# Patient Record
Sex: Male | Born: 1945 | Race: White | Hispanic: No | Marital: Married | Smoking: Current every day smoker
Health system: Southern US, Community
[De-identification: ages and names within clinical notes are randomized; demographics above are authoritative.]

## PROBLEM LIST (undated history)

## (undated) DIAGNOSIS — C801 Malignant (primary) neoplasm, unspecified: Secondary | ICD-10-CM

## (undated) DIAGNOSIS — H9190 Unspecified hearing loss, unspecified ear: Secondary | ICD-10-CM

## (undated) DIAGNOSIS — H269 Unspecified cataract: Secondary | ICD-10-CM

## (undated) DIAGNOSIS — E119 Type 2 diabetes mellitus without complications: Secondary | ICD-10-CM

## (undated) DIAGNOSIS — H547 Unspecified visual loss: Secondary | ICD-10-CM

## (undated) DIAGNOSIS — K861 Other chronic pancreatitis: Secondary | ICD-10-CM

## (undated) HISTORY — PX: LUNG BIOPSY: SHX232

## (undated) HISTORY — DX: Type 2 diabetes mellitus without complications: E11.9

## (undated) HISTORY — PX: WRIST FRACTURE SURGERY: SHX121

## (undated) HISTORY — PX: APPENDECTOMY: SHX54

## (undated) HISTORY — DX: Unspecified cataract: H26.9

## (undated) HISTORY — PX: EYE SURGERY: SHX253

---

## 2001-01-21 ENCOUNTER — Ambulatory Visit (HOSPITAL_COMMUNITY): Admission: RE | Admit: 2001-01-21 | Discharge: 2001-01-21 | Payer: Self-pay | Admitting: Neurology

## 2001-01-21 ENCOUNTER — Encounter: Payer: Self-pay | Admitting: Neurology

## 2003-01-30 ENCOUNTER — Inpatient Hospital Stay (HOSPITAL_COMMUNITY): Admission: AD | Admit: 2003-01-30 | Discharge: 2003-02-01 | Payer: Self-pay | Admitting: *Deleted

## 2003-01-31 ENCOUNTER — Encounter: Payer: Self-pay | Admitting: *Deleted

## 2003-02-06 ENCOUNTER — Inpatient Hospital Stay (HOSPITAL_COMMUNITY): Admission: RE | Admit: 2003-02-06 | Discharge: 2003-02-09 | Payer: Self-pay | Admitting: Internal Medicine

## 2003-02-06 ENCOUNTER — Encounter (INDEPENDENT_AMBULATORY_CARE_PROVIDER_SITE_OTHER): Payer: Self-pay | Admitting: Internal Medicine

## 2003-02-13 ENCOUNTER — Ambulatory Visit (HOSPITAL_COMMUNITY): Admission: RE | Admit: 2003-02-13 | Discharge: 2003-02-13 | Payer: Self-pay | Admitting: Internal Medicine

## 2003-02-13 ENCOUNTER — Encounter (INDEPENDENT_AMBULATORY_CARE_PROVIDER_SITE_OTHER): Payer: Self-pay | Admitting: Internal Medicine

## 2003-02-25 ENCOUNTER — Encounter (INDEPENDENT_AMBULATORY_CARE_PROVIDER_SITE_OTHER): Payer: Self-pay | Admitting: Internal Medicine

## 2003-02-25 ENCOUNTER — Ambulatory Visit (HOSPITAL_COMMUNITY): Admission: RE | Admit: 2003-02-25 | Discharge: 2003-02-25 | Payer: Self-pay | Admitting: Internal Medicine

## 2003-09-11 ENCOUNTER — Ambulatory Visit (HOSPITAL_COMMUNITY): Admission: RE | Admit: 2003-09-11 | Discharge: 2003-09-11 | Payer: Self-pay | Admitting: Internal Medicine

## 2004-09-24 ENCOUNTER — Ambulatory Visit: Payer: Self-pay | Admitting: Internal Medicine

## 2004-09-28 ENCOUNTER — Ambulatory Visit (HOSPITAL_COMMUNITY): Admission: RE | Admit: 2004-09-28 | Discharge: 2004-09-28 | Payer: Self-pay | Admitting: Internal Medicine

## 2004-11-04 ENCOUNTER — Ambulatory Visit (HOSPITAL_COMMUNITY): Admission: RE | Admit: 2004-11-04 | Discharge: 2004-11-04 | Payer: Self-pay | Admitting: Internal Medicine

## 2008-12-05 ENCOUNTER — Emergency Department (HOSPITAL_COMMUNITY): Admission: EM | Admit: 2008-12-05 | Discharge: 2008-12-05 | Payer: Self-pay | Admitting: Emergency Medicine

## 2011-01-29 NOTE — Procedures (Signed)
   NAME:  ELLISON, RIETH NO.:  [id number]   MEDICAL RECORD NO.:  [id number]                   PATIENT TYPE:  OUT   LOCATION:  RAD                                  FACILITY:  [facility]   PHYSICIAN:  Vida Roller, M.D.                DATE OF BIRTH:  08/06/1946   DATE OF PROCEDURE:  02/25/2003  DATE OF DISCHARGE:                                  ECHOCARDIOGRAM   TAPE NUMBER:  The tape number is [id number].   TAPE COUNT:  The tape count is [id number] through 7046.   INDICATIONS:  This is a 65 year old guy with question of impaired cardial  effusion, abdominal pain and fluid build up in his abdomen.   STUDY:  Technical quality of the study was adequate, but incomplete.   FINDINGS:   M-MODE TRACING:  The aorta is 30 mm.   Left atrium is 41 mm.   The septum is 10 mm.   The posterior wall is 9 mm.   Left ventricular diastolic dimension is 43 mm.  Left ventricular systolic  dimension is 31 mm.   TWO-DIMENSIONAL AND DOPPLER IMAGING:  The left ventricle is normal size with  normal systolic function.  There are no wall motion abnormalities seen.  Diastolic function was not assessed.   The right ventricle is mildly enlarged with evidence of a dilated right  ventricular outflow tract.   Both atria are mildly enlarged.  There were no atrioseptal defects.   The aortic valve is trileaflet and tricommissural with no evidence of  stenosis or regurgitation.   The mitral valve is morphologically unremarkable with mild insufficiency.  No stenosis is seen.   The tricuspid valve is morphologically unremarkable with no stenosis or  regurgitation.   The pulmonic valve was not well seen,   The ascending aorta was not well seen.   The pericardial structures appears to have a very small echo lucent area in  the anterior portion of the heart, which could be a pericardial fat pad  versus a small loculated effusion of no hemodynamic significance.   The inferior vena cava  as not well imaged.                                                 Vida Roller, M.D.    JH/MEDQ  D:  02/25/2003  T:  02/25/2003  Job:  [id number]
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
## 2011-01-29 NOTE — Discharge Summary (Signed)
NAME:  Julian Woods, Julian Woods NO.:  [id number]   MEDICAL RECORD NO.:  [id number]                   PATIENT TYPE:  INP   LOCATION:  A305                                 FACILITY:  [facility]   PHYSICIAN:  Sarita Bottom, M.D.                  DATE OF BIRTH:  27-Aug-1946   DATE OF ADMISSION:  01/30/2003  DATE OF DISCHARGE:                                 DISCHARGE SUMMARY   DISCHARGE DIAGNOSES:  1. Community-acquired pneumonia, atypical.  2. Questionable interstitial lung disease.   DISCHARGE MEDICATIONS:  1. Suprax 500 mg three times daily for five more days.  2. Zithromax 500 daily for five days.  3. Atrovent MDI 2 puffs q.6h. p.r.n.   FOLLOW-UP:  The patient is to follow up with primary doctor, Dr. Reola Calkins, of  [facility].  Recommend follow-up chest x-ray and  possible evaluation by pulmonologist.   HISTORY OF PRESENT ILLNESS:  The patient is a 65 year old man.  No  significant chronic medical illness.  Was admitted to [facility] on  Jan 30, 2003, when he presented to the primary doctor's office with the  complaint of malaise, weakness, fever, and chills.  X-ray done in the  doctor's office was suggestive of pneumonia.  The  patient was brought to  [facility] for evaluation and treatment.   PHYSICAL EXAMINATION:  GENERAL:  Well-built, middle-aged man.  VITAL SIGNS:  BP of 107/70, heart rate of 101, temperature of 99.5.  CHEST:  Bilateral basilar crackles.  CARDIOVASCULAR:  Heart sounds 1 and 2 were normal.  ABDOMEN:  Benign.   ADMISSION LABORATORY DATA:  WBC of 21,000 with a neutrophil count of 92,000,  hemoglobin of 15.4.  Sodium was 132, potassium was 3.7, chloride was 100,  CO2 was 24, BUN of 12, creatinine of 1.  Admission ABG showed a pH of 7.4,  pCO2 of 32.8, pO2 of 58.4, bicarbonate of 21.5, oxygen saturation of 91.4%.   His chest x-ray revealed diffuse interstitial lung changes versus atypical  pneumonia.   HOSPITAL COURSE:  The patient was treated with IV ceftriaxone and Zithromax.  He was also placed on oxygen and Atrovent nebulization.  His lipid profile  was checked because the patient had admitted to having hypertriglyceridemia  which, however, was within normal limits.  His clinical course was  significant for improvement in his general medical condition and shortness  of breath.  The patient remained afebrile on IV antibiotics.  He has been  seen on ward rounds today.  He has expressed a desire to go home.  BP is  106/76,  temperature is __________.  His chest is clear to auscultation.  Physical  examination is essentially unremarkable.  The patient will be discharged  home today, to follow up with primary M.D.   CONDITION ON DISCHARGE:  Stable and satisfactory.  Sarita Bottom, M.D.    DW/MEDQ  D:  02/01/2003  T:  02/01/2003  Job:  [id number]   cc:   Dr. Ok Edwards [facility]

## 2011-01-29 NOTE — Discharge Summary (Signed)
NAME:  Julian Woods, Julian Woods NO.:  [id number]   MEDICAL RECORD NO.:  [id number]                   PATIENT TYPE:  INP   LOCATION:  A302                                 FACILITY:  [facility]   PHYSICIAN:  Lionel December, M.D.                 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   CONDITION ON DISCHARGE:  Much improved.   DISCHARGE MEDICATIONS:  1. Levaquin 500 mg p.o. q.d. x5 days.  2. Prednisone 30 mg p.o. q.a.m.  3. Combivent inhaler two puffs four times a day.  4. Robitussin OTC at bedtime p.r.n.   HISTORY OF PRESENT ILLNESS:  The patient is a 65 year old, Caucasian male  who has not been feeling well for about two weeks.  He was admitted to this  facility last week with diagnosis of pneumonia which was treated with  antibiotics.  However, two days after discharge, he was admitted to [facility] with a diagnosis of pancreatitis.  He was treated  and discharged.  However, he continued to have breathing difficulty and  abdominal pain which was not controlled with narcotics.  His wife was an  R.N. in the hospital and asked me to see the patient.  He was evaluated and  noted to have elevated leukocyte count of 25.6.  He had upper and lower  abdominal tenderness and he also had rales and rhonchi.  He was therefore  hospitalized.   LABORATORY DATA AND X-RAY FINDINGS:  His room air ABGs revealed pH of 747,  pCO2 of 29.8 and pO2 of 65.7.  His sedimentation rate was 26.  His H&H was  15 and 42.7, platelet count of 410.  His serum amylase was 104 and lipase  was mildly elevated at 76.  His LFTs were normal except ALT was 44 and  albumin was 2.8.   Chest x-ray revealed persistent bronchitic/interstitial changes  bilaterally,  left lower lobe atelectasis and small left effusion.   HOSPITAL COURSE:  He was begun on IV fluids with IV PPI analgesic and  bronchodilator therapy.  He had abdominopelvic CT which showed minimal  stranding of his pancreatic fat anterior to tail of pancreas felt to be  related to pancreatitis.  He had small bilateral effusions and small to  moderate pericardial effusion.  He did not have any pericardial rub.  His  differential was noted to be with 20% eos.  The following day, his white  cell count was up to 32.7 and he had 23% eos.  It was therefore felt that he  had eosinophilic syndrome causing his pneumonia and possibly pancreatitis  and he was started on IV Solu-Medrol.  He was remarkably improved after  second  dose of Solu-Medrol.  His abdominal tenderness and pain resolved.  He  was also breathing better with improvement in his wheezing.  However, his  pulmonary symptoms were not completely resolved by the time he was  discharged.  He tolerated diet without any difficulty.  He was having less  cough and less rhonchi.  He was discharged to home in much improved  condition.  He is being placed on medical leave.   FOLLOW UP:  Plan is for him to return for CBC, followup chest x-ray and  echocardiogram prior to his office visit in two weeks from now.                                                Lionel December, M.D.    NR/MEDQ  D:  02/11/2003  T:  02/11/2003  Job:  [id number]   cc:   Gaynelle Cage, MD  [phone] [address]  [place]  [place] [address]  Fax: [phone]

## 2015-09-14 HISTORY — PX: CATARACT EXTRACTION: SUR2

## 2017-11-23 NOTE — Progress Notes (Signed)
[facility] Note  11/24/2017     CHIEF COMPLAINT Patient presents for Retina Evaluation and Diabetic Eye Exam   HISTORY OF PRESENT ILLNESS: Julian Woods is a 72 y.o. male who presents to the clinic today for:   HPI    Retina Evaluation    In both eyes.  This started 4 days ago.  Associated Symptoms Blind Spot.  Negative for Glare, Shoulder/Hip pain, Jaw Claudication, Fatigue, Photophobia, Distortion, Floaters, Redness, Scalp Tenderness, Weight Loss, Fever, Trauma, Pain and Flashes.  Context:  distance vision, mid-range vision and near vision.  Treatments tried include no treatments.  I, the attending physician,  performed the HPI with the patient and updated documentation appropriately.          Diabetic Eye Exam    Vision is stable.  Associated Symptoms Negative for Blind Spot, Glare, Shoulder/Hip pain, Fatigue, Jaw Claudication, Photophobia, Distortion, Floaters, Redness, Scalp Tenderness, Weight Loss, Fever, Trauma, Pain and Flashes.  Diabetes characteristics include on insulin, taking oral medications and Type 2.  This started 9 years ago.  Blood sugar level is controlled.  Last Blood Glucose 211.  Last A1C 7.9.  I, the attending physician,  performed the HPI with the patient and updated documentation appropriately.          Comments    Referral from [facility] for eval. For ARMD. Patient states he woke with a black spot in his central vision on Sunday (11/20/17). Pt is DM2, bs are stable per pt , Bs are controled by isulin and glipizide. BS 211 this am, A1C 7.9 (09/2017). Pt reports he lost his hearing left ear three months ago.       Last edited by Bernarda Caffey, MD on 11/24/2017  9:55 AM. (History)    Pt states he "woke up Sunday morning and couldn't see" out of OS; Pt states he went to the [facility] and was told "my retina is bleeding"; Pt reports he had cataract sx approx 3 years ago from [facility]; Pt states OU VA "gets blurry at  times but I think its from my sugar"; Pt states he has been told he does not have macular degeneration; Pt states OS VA was better than OD prior to VA decrease; Pt denies being on blood thinners; Pt reports he is a current everyday smoker, states he smokes small filtered cigars; Pt states he had no ocular issues prior to March 10th; Pt states he "lost hearing in my right ear" x 3 weeks ago, states there was a "loud ringing in my ear and then I lost my hearing";   Referring physician: [facility] [address] [place], [place] [address]  HISTORICAL INFORMATION:   Selected notes from the MEDICAL RECORD NUMBER Referred by [facility] for retinal evaluation;  LEE- 03.12.19 (J. Mazzarella) [BCVA OD: 20/25 OS: HM] Ocular Hx- Exu AMD OS, pseudophakia OU PMH- DM (A1C- 8.9 (01.23.19))    CURRENT MEDICATIONS: No current outpatient medications on file. (Ophthalmic Drugs)   No current facility-administered medications for this visit.  (Ophthalmic Drugs)   Current Outpatient Medications (Other)  Medication Sig  . gemfibrozil (LOPID) 600 MG tablet Take by mouth.  Marland Kitchen glipiZIDE (GLUCOTROL) 10 MG tablet Take by mouth.  . insulin regular (NOVOLIN R,HUMULIN R) 100 units/mL injection Inject into the skin.  Marland Kitchen morphine (KADIAN) 10 MG 24 hr capsule Take by mouth.  . traMADol (ULTRAM) 50 MG tablet Take by mouth.   Current Facility-Administered Medications (  Other)  Medication Route  . Bevacizumab (AVASTIN) SOLN 1.25 mg Intravitreal      REVIEW OF SYSTEMS: ROS    Positive for: Endocrine, Cardiovascular, Eyes   Negative for: Constitutional, Gastrointestinal, Neurological, Skin, Genitourinary, Musculoskeletal, HENT, Respiratory, Psychiatric, Allergic/Imm, Heme/Lymph   Last edited by Zenovia Jordan, LPN on 4/62/7035  0:09 AM. (History)       ALLERGIES Allergies  Allergen Reactions  . Ventolin [Albuterol] Anaphylaxis    PAST MEDICAL HISTORY Past Medical  History:  Diagnosis Date  . Cataract   . Diabetes [facility])    Past Surgical History:  Procedure Laterality Date  . APPENDECTOMY    . CATARACT EXTRACTION  2017  . EYE SURGERY    . LUNG BIOPSY    . WRIST FRACTURE SURGERY      FAMILY HISTORY History reviewed. No pertinent family history.  SOCIAL HISTORY Social History   Tobacco Use  . Smoking status: Not on file  Substance Use Topics  . Alcohol use: Not on file  . Drug use: Not on file         OPHTHALMIC EXAM:  Base Eye Exam    Visual Acuity (Snellen - Linear)      Right Left   Dist cc 20/25 +2 CF at face   Dist ph cc NI NI   Correction:  Glasses       Tonometry (Tonopen, 9:42 AM)      Right Left   Pressure 14 16       Pupils      Dark Light Shape React APD   Right 2 1.5 Round Slow None   Left 2 1.5 Round Slow None       Visual Fields (Counting fingers)      Left Right     Full   Restrictions Total superior temporal, inferior temporal, superior nasal, inferior nasal deficiencies        Extraocular Movement      Right Left    Full, Ortho Full, Ortho       Neuro/Psych    Oriented x3:  Yes   Mood/Affect:  Normal       Dilation    Both eyes:  1.0% Mydriacyl, 2.5% Phenylephrine @ 9:43 AM        Slit Lamp and Fundus Exam    External Exam      Right Left   External Brow ptosis Brow ptosis       Slit Lamp Exam      Right Left   Lids/Lashes Dermatochalasis - upper lid Dermatochalasis - upper lid   Conjunctiva/Sclera White and quiet Mild Nasal and temporal Pinguecula   Cornea Mild Arcus, Well healed temporal cataract wounds Mild Arcus, Well healed temporal cataract wounds, 1+ Punctate epithelial erosions   Anterior Chamber Deep and quiet, no cell or flare Deep and quiet, no cell or flare   Iris Round and dilated Round and dilated   Lens Three piece PC IOL in good position, clear PC Three piece PC IOL in good position, clear PC   Vitreous Vitreous syneresis Vitreous syneresis       Fundus Exam       Right Left   Disc Normal    C/D Ratio 0.35 0.3   Macula Blunted foveal reflex, Retinal pigment epithelial mottling, mild  Drusen Massive sub-retinal hemorrhage covering 80% of macula extending below inferior arcades,    Vessels Mild Vascular attenuation Normal   Periphery Attached Attached        Refraction  Wearing Rx      Sphere Cylinder Axis Add   Right -0.25 +1.00 012 +2.25   Left [place] Sphere  +2.25   Type:  Bifocal       Manifest Refraction      Sphere Cylinder Axis Dist VA   Right -0.25 +1.50 012 20/20-1   Left      Pt states he only sees black out of OS          IMAGING AND PROCEDURES  Imaging and Procedures for 11/25/17  OCT, Retina - OU - Both Eyes     Right Eye Quality was good. Central Foveal Thickness: 291. Progression has no prior data. Findings include normal foveal contour, no IRF, no SRF, retinal drusen , pigment epithelial detachment.   Left Eye Quality was good. Central Foveal Thickness: 521. Progression has no prior data. Findings include abnormal foveal contour, subretinal fluid, subretinal hyper-reflective material, pigment epithelial detachment, intraretinal fluid.   Notes *Images captured and stored on drive  Diagnosis / Impression:  OD: non-exudative ARMD OS: exudative ARMD with massive sub-foveal hemorrhage  Clinical management:  See below  Abbreviations: NFP - Normal foveal profile. CME - cystoid macular edema. PED - pigment epithelial detachment. IRF - intraretinal fluid. SRF - subretinal fluid. EZ - ellipsoid zone. ERM - epiretinal membrane. ORA - outer retinal atrophy. ORT - outer retinal tubulation. SRHM - subretinal hyper-reflective material         Intravitreal Injection, Pharmacologic Agent - OS - Left Eye     Time Out 11/24/2017. 11:00 AM. Confirmed correct patient, procedure, site, and patient consented.   Anesthesia Topical anesthesia was used. Anesthetic medications included Tetracaine 0.5%, Lidocaine 2%.    Procedure Preparation included 5% betadine to ocular surface, eyelid speculum. A 30 gauge needle was used.   Injection: 1.25 mg Bevacizumab 1.25mg /0.60ml   NDC: 87564-332-95    Lot: [id number]     Expiration Date: 12/25/2017   Route: Intravitreal   Site: Left Eye   Waste: 0 mg  Post-op Post injection exam found visual acuity of at least counting fingers. The patient tolerated the procedure well. There were no complications. The patient received written and verbal post procedure care education.                 ASSESSMENT/PLAN:    ICD-10-CM   1. Exudative age-related macular degeneration of left eye with active choroidal neovascularization (HCC) H35.3221 Intravitreal Injection, Pharmacologic Agent - OS - Left Eye    Bevacizumab (AVASTIN) SOLN 1.25 mg  2. Macular subretinal hemorrhage of left eye H35.62 Intravitreal Injection, Pharmacologic Agent - OS - Left Eye    Bevacizumab (AVASTIN) SOLN 1.25 mg  3. Retinal edema H35.81 OCT, Retina - OU - Both Eyes  4. 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 Pseudophakia of both eyes Z96.1     1,2,3. Exudative age related macular degeneration, OS   - The incidence pathology and anatomy of wet AMD discussed   - The ANCHOR, MARINA, CATT and VIEW trials discussed with patient.    - discussed treatment options including observation vs intravitreal anti-VEGF agents such as Avastin, Lucentis, Eylea.    - Risks of endophthalmitis and vascular occlusive events and atrophic changes discussed with patient  - massive submacular/subfoveal hemorrhage, acute onset of vision loss 11/20/17  - OCT with massive macular edema secondary to hemorrhage  - discussed treatment options including surgery to try to displace subretinal hemorrhage and anti-VEGF therapy  - pt  wishes to be treated with IVA OS #1 today, 3.14.19  - RBA of procedure discussed, questions answered  - informed consent obtained and  signed  - see procedure note  - recommend second opinion from another retinal specialist regarding surgery to displace subretinal heme  - long discussion of guarded prognosis OS   - will refer to Drs. Dwana Melena and Ellyn Hack, Vitreoretinal Service at [facility] for 2nd opinion / evaluation and management of subretinal heme OS  - f/u in 4-6 wks   4. Age related macular degeneration, non-exudative, OD  - The incidence, anatomy, and pathology of dry AMD, risk of progression, and the AREDS and AREDS 2 study including smoking risks discussed with patient.  - Recommend amsler grid monitoring  5. Pseudophakia OU  - s/p CE/IOL OU at [facility]  - doing well  - monitor   Ophthalmic Meds Ordered this visit:  Meds ordered this encounter  Medications  . Bevacizumab (AVASTIN) SOLN 1.25 mg       Return in about 4 weeks (around 12/22/2017) for Dilated Exam, OCT.  There are no Patient Instructions on file for this visit.   Explained the diagnoses, plan, and follow up with the patient and they expressed understanding.  Patient expressed understanding of the importance of proper follow up care.   This document serves as a record of services personally performed by Gardiner Sleeper, MD, PhD. It was created on their behalf by Catha Brow, [place], a certified ophthalmic assistant. The creation of this record is the provider's dictation and/or activities during the visit.  Electronically signed by: Catha Brow, [place]  11/25/17 12:54 PM    Gardiner Sleeper, M.D., Ph.D. Diseases & Surgery of the Retina and [place] 11/25/17   I have reviewed the above documentation for accuracy and completeness, and I agree with the above. Gardiner Sleeper, M.D., Ph.D. 11/25/17 12:54 PM     Abbreviations: M myopia (nearsighted); A astigmatism; H hyperopia (farsighted); P presbyopia; Mrx spectacle prescription;  CTL contact lenses; OD right eye;  OS left eye; OU both eyes  XT exotropia; ET esotropia; PEK punctate epithelial keratitis; PEE punctate epithelial erosions; DES dry eye syndrome; MGD meibomian gland dysfunction; ATs artificial tears; PFAT's preservative free artificial tears; [place] nuclear sclerotic cataract; PSC posterior subcapsular cataract; ERM epi-retinal membrane; PVD posterior vitreous detachment; RD retinal detachment; DM diabetes mellitus; DR diabetic retinopathy; NPDR non-proliferative diabetic retinopathy; PDR proliferative diabetic retinopathy; CSME clinically significant macular edema; DME diabetic macular edema; dbh dot blot hemorrhages; CWS cotton wool spot; POAG primary open angle glaucoma; C/D cup-to-disc ratio; HVF humphrey visual field; GVF goldmann visual field; OCT optical coherence tomography; IOP intraocular pressure; BRVO Branch retinal vein occlusion; CRVO central retinal vein occlusion; CRAO central retinal artery occlusion; BRAO branch retinal artery occlusion; RT retinal tear; SB scleral buckle; PPV pars plana vitrectomy; VH Vitreous hemorrhage; PRP panretinal laser photocoagulation; IVK intravitreal kenalog; VMT vitreomacular traction; MH Macular hole;  NVD neovascularization of the disc; NVE neovascularization elsewhere; AREDS age related eye disease study; ARMD age related macular degeneration; POAG primary open angle glaucoma; EBMD epithelial/anterior basement membrane dystrophy; ACIOL anterior chamber intraocular lens; IOL intraocular lens; PCIOL posterior chamber intraocular lens; Phaco/IOL phacoemulsification with intraocular lens placement; [place] photorefractive keratectomy; LASIK laser assisted in situ keratomileusis; HTN hypertension; DM diabetes mellitus; COPD chronic obstructive pulmonary disease

## 2017-11-24 ENCOUNTER — Encounter (INDEPENDENT_AMBULATORY_CARE_PROVIDER_SITE_OTHER): Payer: Self-pay | Admitting: Ophthalmology

## 2017-11-24 ENCOUNTER — Ambulatory Visit (INDEPENDENT_AMBULATORY_CARE_PROVIDER_SITE_OTHER): Payer: Medicare Other | Admitting: Ophthalmology

## 2017-11-24 DIAGNOSIS — H3581 Retinal edema: Secondary | ICD-10-CM

## 2017-11-24 DIAGNOSIS — Z961 Presence of intraocular lens: Secondary | ICD-10-CM

## 2017-11-24 DIAGNOSIS — H353111 Nonexudative age-related macular degeneration, right eye, early dry stage: Secondary | ICD-10-CM

## 2017-11-24 DIAGNOSIS — H353221 Exudative age-related macular degeneration, left eye, with active choroidal neovascularization: Secondary | ICD-10-CM | POA: Diagnosis not present

## 2017-11-24 DIAGNOSIS — H3562 Retinal hemorrhage, left eye: Secondary | ICD-10-CM | POA: Diagnosis not present

## 2017-11-25 ENCOUNTER — Encounter (INDEPENDENT_AMBULATORY_CARE_PROVIDER_SITE_OTHER): Payer: Self-pay | Admitting: Ophthalmology

## 2017-11-25 DIAGNOSIS — H3562 Retinal hemorrhage, left eye: Secondary | ICD-10-CM | POA: Diagnosis not present

## 2017-11-25 DIAGNOSIS — H353221 Exudative age-related macular degeneration, left eye, with active choroidal neovascularization: Secondary | ICD-10-CM | POA: Diagnosis not present

## 2017-11-25 MED ORDER — BEVACIZUMAB CHEMO INJECTION 1.25MG/0.05ML SYRINGE FOR KALEIDOSCOPE
1.2500 mg | INTRAVITREAL | Status: DC
  Administered 2017-11-25: 1.25 mg via INTRAVITREAL
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
## 2017-12-23 ENCOUNTER — Ambulatory Visit (INDEPENDENT_AMBULATORY_CARE_PROVIDER_SITE_OTHER): Payer: Medicare Other | Admitting: Ophthalmology

## 2017-12-23 ENCOUNTER — Encounter (INDEPENDENT_AMBULATORY_CARE_PROVIDER_SITE_OTHER): Payer: Self-pay | Admitting: Ophthalmology

## 2017-12-23 DIAGNOSIS — H3562 Retinal hemorrhage, left eye: Secondary | ICD-10-CM | POA: Diagnosis not present

## 2017-12-23 DIAGNOSIS — H3581 Retinal edema: Secondary | ICD-10-CM

## 2017-12-23 DIAGNOSIS — H353111 Nonexudative age-related macular degeneration, right eye, early dry stage: Secondary | ICD-10-CM

## 2017-12-23 DIAGNOSIS — Z961 Presence of intraocular lens: Secondary | ICD-10-CM

## 2017-12-23 DIAGNOSIS — H353221 Exudative age-related macular degeneration, left eye, with active choroidal neovascularization: Secondary | ICD-10-CM | POA: Diagnosis not present

## 2017-12-26 ENCOUNTER — Encounter (INDEPENDENT_AMBULATORY_CARE_PROVIDER_SITE_OTHER): Payer: Self-pay | Admitting: Ophthalmology

## 2017-12-28 NOTE — Progress Notes (Signed)
[facility] Note  12/30/2017     CHIEF COMPLAINT Patient presents for Retina Follow Up   HISTORY OF PRESENT ILLNESS: Julian Woods is a 72 y.o. male who presents to the clinic today for:   HPI    Retina Follow Up    Patient presents with  Wet AMD.  In left eye.  Severity is severe.  Since onset it is stable.  I, the attending physician,  performed the HPI with the patient and updated documentation appropriately.          Comments    72 y/o male pt here for 1 wk f/u for exudative amd os w/active choroidal neovascularization.  No change in vision ou.  Denies pain, flashes.  Is seeing a few more wavy floaters ou.  ATs prn ou.  BS this a.m. Was 180.  A1C 2 wks ago was 7.0.       Last edited by Bernarda Caffey, MD on 01/02/2018  9:32 AM. (History)    Here today for IVA injection OS  Referring physician: No referring provider defined for this encounter.  HISTORICAL INFORMATION:   Selected notes from the MEDICAL RECORD NUMBER Referred by [place] for retinal evaluation;  LEE- 03.12.19 (J. Mazzarella) [BCVA OD: 20/25 OS: HM] Ocular Hx- Exu AMD OS, pseudophakia OU PMH- DM (A1C- 8.9 (01.23.19))    CURRENT MEDICATIONS: No current outpatient medications on file. (Ophthalmic Drugs)   No current facility-administered medications for this visit.  (Ophthalmic Drugs)   Current Outpatient Medications (Other)  Medication Sig  . gemfibrozil (LOPID) 600 MG tablet Take by mouth.  Marland Kitchen glipiZIDE (GLUCOTROL) 10 MG tablet Take by mouth.  . insulin regular (NOVOLIN R,HUMULIN R) 100 units/mL injection Inject into the skin.  Marland Kitchen morphine (KADIAN) 10 MG 24 hr capsule Take by mouth.  . traMADol (ULTRAM) 50 MG tablet Take by mouth.   Current Facility-Administered Medications (Other)  Medication Route  . Bevacizumab (AVASTIN) SOLN 1.25 mg Intravitreal  . Bevacizumab (AVASTIN) SOLN 1.25 mg Intravitreal      REVIEW OF SYSTEMS: ROS    Positive for:  Endocrine, Eyes   Negative for: Constitutional, Gastrointestinal, Neurological, Skin, Genitourinary, Musculoskeletal, HENT, Cardiovascular, Respiratory, Psychiatric, Allergic/Imm, Heme/Lymph   Last edited by Matthew Folks, COA on 12/30/2017 10:38 AM. (History)       ALLERGIES Allergies  Allergen Reactions  . Ventolin [Albuterol] Anaphylaxis    PAST MEDICAL HISTORY Past Medical History:  Diagnosis Date  . Cataract   . Diabetes [facility])    Past Surgical History:  Procedure Laterality Date  . APPENDECTOMY    . CATARACT EXTRACTION  2017  . EYE SURGERY    . LUNG BIOPSY    . WRIST FRACTURE SURGERY      FAMILY HISTORY History reviewed. No pertinent family history.  SOCIAL HISTORY Social History   Tobacco Use  . Smoking status: Not on file  Substance Use Topics  . Alcohol use: Not on file  . Drug use: Not on file         OPHTHALMIC EXAM:  Base Eye Exam    Visual Acuity (Snellen - Linear)      Right Left   Dist [place] 20/25 +2 CF @ face   Dist ph cc  CF @ face   Correction:  Glasses       Tonometry (Tonopen, 10:35 AM)      Right Left   Pressure 10 12       Pupils  Dark Light Shape React APD   Right 2 1.5 Round Slow None   Left 2 1.5 Round Slow None       Visual Fields (Counting fingers)      Left Right     Full   Restrictions Total superior temporal, inferior temporal, superior nasal, inferior nasal deficiencies        Extraocular Movement      Right Left    Full, Ortho Full, Ortho       Neuro/Psych    Oriented x3:  Yes   Mood/Affect:  Normal       Dilation    Both eyes:  1.0% Mydriacyl, 2.5% Phenylephrine @ 10:35 AM        Slit Lamp and Fundus Exam    External Exam      Right Left   External Brow ptosis Brow ptosis       Slit Lamp Exam      Right Left   Lids/Lashes Dermatochalasis - upper lid Dermatochalasis - upper lid   Conjunctiva/Sclera White and quiet Mild Nasal and temporal Pinguecula   Cornea Mild Arcus, Well healed  temporal cataract wounds Mild Arcus, Well healed temporal cataract wounds, 1+ Punctate epithelial erosions   Anterior Chamber Deep and quiet, no cell or flare Deep and quiet, no cell or flare   Iris Round and dilated Round and dilated   Lens Three piece PC IOL in good position, clear PC Three piece PC IOL in good position, clear PC   Vitreous Vitreous syneresis, Posterior vitreous detachment, Weiss ring Vitreous syneresis, central whit vitreous hemorrhage       Fundus Exam      Right Left   Disc Pink and Sharp    C/D Ratio 0.3 0.3   Macula Blunted foveal reflex, Retinal pigment epithelial mottling, mild  Drusen white sub-retinal hemorrhage clearing   Vessels Mild Vascular attenuation Normal   Periphery Attached Attached          IMAGING AND PROCEDURES  Imaging and Procedures for 01/02/18  OCT, Retina - OU - Both Eyes       Right Eye Quality was good. Central Foveal Thickness: 293. Progression has been stable. Findings include normal foveal contour, no IRF, no SRF, retinal drusen , pigment epithelial detachment.   Left Eye Quality was good. Central Foveal Thickness: (Unable to obtain). Progression has been stable (Unable to compare). Findings include abnormal foveal contour, subretinal fluid, subretinal hyper-reflective material, pigment epithelial detachment, intraretinal fluid.   Notes *Images captured and stored on drive  Diagnosis / Impression:  OD: non-exudative ARMD OS: exudative ARMD with massive sub-foveal hemorrhage  Clinical management:  See below  Abbreviations: NFP - Normal foveal profile. CME - cystoid macular edema. PED - pigment epithelial detachment. IRF - intraretinal fluid. SRF - subretinal fluid. EZ - ellipsoid zone. ERM - epiretinal membrane. ORA - outer retinal atrophy. ORT - outer retinal tubulation. SRHM - subretinal hyper-reflective material         Intravitreal Injection, Pharmacologic Agent - OS - Left Eye       Time Out 12/30/2017. 11:59  AM. Confirmed correct patient, procedure, site, and patient consented.   Anesthesia Topical anesthesia was used. Anesthetic medications included Tetracaine 0.5%, Lidocaine 2%.   Procedure Preparation included 5% betadine to ocular surface, eyelid speculum. A supplied needle was used.   Injection: 1.25 mg Bevacizumab 1.25mg /0.65ml   NDC: 73220-254-27    Lot: [id number]     Expiration Date: 01/25/2018   Route: Intravitreal   Site:  Left Eye   Waste: 0 mg  Post-op Post injection exam found visual acuity of at least counting fingers. The patient tolerated the procedure well. There were no complications. The patient received written and verbal post procedure care education.                 ASSESSMENT/PLAN:    ICD-10-CM   1. Exudative age-related macular degeneration of left eye with active choroidal neovascularization (HCC) H35.3221 OCT, Retina - OU - Both Eyes    Intravitreal Injection, Pharmacologic Agent - OS - Left Eye    Bevacizumab (AVASTIN) SOLN 1.25 mg  2. Macular subretinal hemorrhage of left eye H35.62   3. Retinal edema H35.81   4. 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 Pseudophakia of both eyes Z96.1     1,2,3. Exudative age related macular degeneration, OS   - The incidence pathology and anatomy of wet AMD discussed   - The ANCHOR, MARINA, CATT and VIEW trials discussed with patient.    - discussed treatment options including observation vs intravitreal anti-VEGF agents such as Avastin, Lucentis, Eylea.    - Risks of endophthalmitis and vascular occlusive events and atrophic changes discussed with patient  - massive submacular/subfoveal hemorrhage, acute onset of vision loss 11/20/17  - S/P IVA OS #1 (03.14.19)  - today, subfoveal heme is turning white and there is some breakthrough heme into vitreous  - OCT with persistent massive macular edema secondary to hemorrhage  - recommend IVA #2 OS today (04.19.18)  -  RBA of procedure discussed, questions answered  - informed consent obtained and signed  - see procedure note  - discussed treatment options including surgery to try to displace subretinal hemorrhage and anti-VEGF therapy   - recommended second opinion from another retinal specialist regarding surgery to displace subretinal heme  - long discussion of guarded prognosis OS   - referred to Drs. Dwana Melena and Ellyn Hack, Vitreoretinal Service at [facility] for 2nd opinion / evaluation and management of subretinal heme OS - pt refuses to go to [facility]  - f/u in 4 weeks  4. Age related macular degeneration, non-exudative, OD  - The incidence, anatomy, and pathology of dry AMD, risk of progression, and the AREDS and AREDS 2 study including smoking risks discussed with patient.  - Recommend amsler grid monitoring  - continue amsler grid monitoring  5. Pseudophakia OU  - s/p CE[facility]OL OU at [facility]  - doing well  - continue to moniotr   Ophthalmic Meds Ordered this visit:  Meds ordered this encounter  Medications  . Bevacizumab (AVASTIN) SOLN 1.25 mg       Return in about 1 month (around 01/27/2018) for F/U Exu AMD OS, Dilated exam, OCT.  There are no Patient Instructions on file for this visit.   Explained the diagnoses, plan, and follow up with the patient and they expressed understanding.  Patient expressed understanding of the importance of proper follow up care.   This document serves as a record of services personally performed by Gardiner Sleeper, MD, PhD. It was created on their behalf by Catha Brow, [place], a certified ophthalmic assistant. The creation of this record is the provider's dictation and/or activities during the visit.  Electronically signed by: Catha Brow, [place]  01/02/18 9:39 AM   Gardiner Sleeper, M.D., Ph.D. Diseases & Surgery of the Retina and [place] 01/02/18  I have reviewed  the above documentation for accuracy and completeness, and I agree with the above. Gardiner Sleeper, M.D., Ph.D. 01/02/18 9:40 AM    Abbreviations: M myopia (nearsighted); A astigmatism; H hyperopia (farsighted); P presbyopia; Mrx spectacle prescription;  CTL contact lenses; OD right eye; OS left eye; OU both eyes  XT exotropia; ET esotropia; PEK punctate epithelial keratitis; PEE punctate epithelial erosions; DES dry eye syndrome; MGD meibomian gland dysfunction; ATs artificial tears; PFAT's preservative free artificial tears; [place] nuclear sclerotic cataract; PSC posterior subcapsular cataract; ERM epi-retinal membrane; PVD posterior vitreous detachment; RD retinal detachment; DM diabetes mellitus; DR diabetic retinopathy; NPDR non-proliferative diabetic retinopathy; PDR proliferative diabetic retinopathy; CSME clinically significant macular edema; DME diabetic macular edema; dbh dot blot hemorrhages; CWS cotton wool spot; POAG primary open angle glaucoma; C/D cup-to-disc ratio; HVF humphrey visual field; GVF goldmann visual field; OCT optical coherence tomography; IOP intraocular pressure; BRVO Branch retinal vein occlusion; CRVO central retinal vein occlusion; CRAO central retinal artery occlusion; BRAO branch retinal artery occlusion; RT retinal tear; SB scleral buckle; PPV pars plana vitrectomy; VH Vitreous hemorrhage; PRP panretinal laser photocoagulation; IVK intravitreal kenalog; VMT vitreomacular traction; MH Macular hole;  NVD neovascularization of the disc; NVE neovascularization elsewhere; AREDS age related eye disease study; ARMD age related macular degeneration; POAG primary open angle glaucoma; EBMD epithelial/anterior basement membrane dystrophy; ACIOL anterior chamber intraocular lens; IOL intraocular lens; PCIOL posterior chamber intraocular lens; Phaco/IOL phacoemulsification with intraocular lens placement; [place] photorefractive keratectomy; LASIK laser assisted in situ keratomileusis; HTN  hypertension; DM diabetes mellitus; COPD chronic obstructive pulmonary disease

## 2017-12-30 ENCOUNTER — Ambulatory Visit (INDEPENDENT_AMBULATORY_CARE_PROVIDER_SITE_OTHER): Payer: Medicare Other | Admitting: Ophthalmology

## 2017-12-30 ENCOUNTER — Encounter (INDEPENDENT_AMBULATORY_CARE_PROVIDER_SITE_OTHER): Payer: Self-pay | Admitting: Ophthalmology

## 2017-12-30 DIAGNOSIS — H353221 Exudative age-related macular degeneration, left eye, with active choroidal neovascularization: Secondary | ICD-10-CM | POA: Diagnosis not present

## 2017-12-30 DIAGNOSIS — H3581 Retinal edema: Secondary | ICD-10-CM

## 2017-12-30 DIAGNOSIS — H353111 Nonexudative age-related macular degeneration, right eye, early dry stage: Secondary | ICD-10-CM

## 2017-12-30 DIAGNOSIS — Z961 Presence of intraocular lens: Secondary | ICD-10-CM

## 2017-12-30 DIAGNOSIS — H3562 Retinal hemorrhage, left eye: Secondary | ICD-10-CM

## 2018-01-02 ENCOUNTER — Encounter (INDEPENDENT_AMBULATORY_CARE_PROVIDER_SITE_OTHER): Payer: Self-pay | Admitting: Ophthalmology

## 2018-01-02 DIAGNOSIS — H353221 Exudative age-related macular degeneration, left eye, with active choroidal neovascularization: Secondary | ICD-10-CM | POA: Diagnosis not present

## 2018-01-02 MED ORDER — BEVACIZUMAB CHEMO INJECTION 1.25MG/0.05ML SYRINGE FOR KALEIDOSCOPE
1.2500 mg | INTRAVITREAL | Status: DC
  Administered 2018-01-02: 1.25 mg via INTRAVITREAL

## 2018-01-30 ENCOUNTER — Encounter (INDEPENDENT_AMBULATORY_CARE_PROVIDER_SITE_OTHER): Payer: Self-pay | Admitting: Ophthalmology

## 2018-02-06 ENCOUNTER — Emergency Department (HOSPITAL_COMMUNITY): Payer: Medicare Other

## 2018-02-06 ENCOUNTER — Inpatient Hospital Stay (HOSPITAL_COMMUNITY)
Admission: EM | Admit: 2018-02-06 | Discharge: 2018-02-06 | DRG: 193 | Discharge status: Against Medical Advice | Payer: Medicare Other | Attending: Family Medicine | Admitting: Family Medicine

## 2018-02-06 ENCOUNTER — Other Ambulatory Visit: Payer: Self-pay

## 2018-02-06 ENCOUNTER — Encounter (HOSPITAL_COMMUNITY): Payer: Self-pay | Admitting: Emergency Medicine

## 2018-02-06 DIAGNOSIS — H9190 Unspecified hearing loss, unspecified ear: Secondary | ICD-10-CM | POA: Diagnosis present

## 2018-02-06 DIAGNOSIS — E119 Type 2 diabetes mellitus without complications: Secondary | ICD-10-CM | POA: Diagnosis present

## 2018-02-06 DIAGNOSIS — E860 Dehydration: Secondary | ICD-10-CM | POA: Diagnosis present

## 2018-02-06 DIAGNOSIS — K861 Other chronic pancreatitis: Secondary | ICD-10-CM | POA: Diagnosis present

## 2018-02-06 DIAGNOSIS — J9601 Acute respiratory failure with hypoxia: Secondary | ICD-10-CM | POA: Diagnosis present

## 2018-02-06 DIAGNOSIS — J181 Lobar pneumonia, unspecified organism: Principal | ICD-10-CM | POA: Diagnosis present

## 2018-02-06 DIAGNOSIS — F17200 Nicotine dependence, unspecified, uncomplicated: Secondary | ICD-10-CM | POA: Diagnosis present

## 2018-02-06 DIAGNOSIS — C3490 Malignant neoplasm of unspecified part of unspecified bronchus or lung: Secondary | ICD-10-CM | POA: Diagnosis present

## 2018-02-06 DIAGNOSIS — Z888 Allergy status to other drugs, medicaments and biological substances status: Secondary | ICD-10-CM | POA: Diagnosis not present

## 2018-02-06 DIAGNOSIS — R4182 Altered mental status, unspecified: Secondary | ICD-10-CM | POA: Diagnosis not present

## 2018-02-06 DIAGNOSIS — Z794 Long term (current) use of insulin: Secondary | ICD-10-CM

## 2018-02-06 DIAGNOSIS — C3492 Malignant neoplasm of unspecified part of left bronchus or lung: Secondary | ICD-10-CM | POA: Diagnosis present

## 2018-02-06 DIAGNOSIS — E876 Hypokalemia: Secondary | ICD-10-CM | POA: Diagnosis present

## 2018-02-06 DIAGNOSIS — J189 Pneumonia, unspecified organism: Secondary | ICD-10-CM | POA: Diagnosis present

## 2018-02-06 DIAGNOSIS — Z9849 Cataract extraction status, unspecified eye: Secondary | ICD-10-CM | POA: Diagnosis not present

## 2018-02-06 DIAGNOSIS — G9341 Metabolic encephalopathy: Secondary | ICD-10-CM | POA: Diagnosis present

## 2018-02-06 DIAGNOSIS — H353221 Exudative age-related macular degeneration, left eye, with active choroidal neovascularization: Secondary | ICD-10-CM

## 2018-02-06 DIAGNOSIS — Z66 Do not resuscitate: Secondary | ICD-10-CM | POA: Diagnosis present

## 2018-02-06 DIAGNOSIS — H3562 Retinal hemorrhage, left eye: Secondary | ICD-10-CM

## 2018-02-06 DIAGNOSIS — R0602 Shortness of breath: Secondary | ICD-10-CM | POA: Diagnosis present

## 2018-02-06 HISTORY — DX: Unspecified visual loss: H54.7

## 2018-02-06 HISTORY — DX: Unspecified hearing loss, unspecified ear: H91.90

## 2018-02-06 LAB — COMPREHENSIVE METABOLIC PANEL
ALK PHOS: 92 U/L (ref 38–126)
ALT: 23 U/L (ref 17–63)
AST: 28 U/L (ref 15–41)
Albumin: 3.6 g/dL (ref 3.5–5.0)
Anion gap: 9 (ref 5–15)
BILIRUBIN TOTAL: 0.7 mg/dL (ref 0.3–1.2)
BUN: 21 mg/dL — AB (ref 6–20)
CALCIUM: 9.9 mg/dL (ref 8.9–10.3)
CHLORIDE: 96 mmol/L — AB (ref 101–111)
CO2: 33 mmol/L — ABNORMAL HIGH (ref 22–32)
CREATININE: 0.9 mg/dL (ref 0.61–1.24)
Glucose, Bld: 85 mg/dL (ref 65–99)
Potassium: 2.5 mmol/L — CL (ref 3.5–5.1)
Sodium: 138 mmol/L (ref 135–145)
Total Protein: 7.4 g/dL (ref 6.5–8.1)

## 2018-02-06 LAB — CBC WITH DIFFERENTIAL/PLATELET
BASOS ABS: 0 10*3/uL (ref 0.0–0.1)
Basophils Relative: 0 %
EOS PCT: 1 %
Eosinophils Absolute: 0.1 10*3/uL (ref 0.0–0.7)
HEMATOCRIT: 38.7 % — AB (ref 39.0–52.0)
HEMOGLOBIN: 13.4 g/dL (ref 13.0–17.0)
LYMPHS ABS: 2.3 10*3/uL (ref 0.7–4.0)
LYMPHS PCT: 16 %
MCH: 30.1 pg (ref 26.0–34.0)
MCHC: 34.6 g/dL (ref 30.0–36.0)
MCV: 87 fL (ref 78.0–100.0)
Monocytes Absolute: 0.8 10*3/uL (ref 0.1–1.0)
Monocytes Relative: 6 %
NEUTROS ABS: 11.3 10*3/uL — AB (ref 1.7–7.7)
Neutrophils Relative %: 77 %
Platelets: 343 10*3/uL (ref 150–400)
RBC: 4.45 MIL/uL (ref 4.22–5.81)
RDW: 12.6 % (ref 11.5–15.5)
WBC: 14.5 10*3/uL — AB (ref 4.0–10.5)

## 2018-02-06 LAB — TROPONIN I

## 2018-02-06 LAB — URINALYSIS, ROUTINE W REFLEX MICROSCOPIC
BACTERIA UA: NONE SEEN
GLUCOSE, UA: NEGATIVE mg/dL
HGB URINE DIPSTICK: NEGATIVE
KETONES UR: NEGATIVE mg/dL
Leukocytes, UA: NEGATIVE
NITRITE: NEGATIVE
PROTEIN: 100 mg/dL — AB
Specific Gravity, Urine: 1.029 (ref 1.005–1.030)
pH: 7 (ref 5.0–8.0)

## 2018-02-06 LAB — BRAIN NATRIURETIC PEPTIDE: B Natriuretic Peptide: 66 pg/mL (ref 0.0–100.0)

## 2018-02-06 LAB — LIPASE, BLOOD: LIPASE: 22 U/L (ref 11–51)

## 2018-02-06 LAB — CBG MONITORING, ED: GLUCOSE-CAPILLARY: 175 mg/dL — AB (ref 65–99)

## 2018-02-06 MED ORDER — TRAMADOL HCL 50 MG PO TABS: 100.0000 mg | ORAL_TABLET | Freq: Three times a day (TID) | ORAL | Status: DC | PRN

## 2018-02-06 MED ORDER — SODIUM CHLORIDE 0.9 % IV SOLN
250.0000 mL | INTRAVENOUS | Status: DC | PRN
Start: 2018-02-06 — End: 2018-02-06

## 2018-02-06 MED ORDER — SODIUM CHLORIDE 0.9% FLUSH: 3.0000 mL | Freq: Two times a day (BID) | INTRAVENOUS | Status: DC

## 2018-02-06 MED ORDER — SODIUM CHLORIDE 0.9% FLUSH: 3.0000 mL | INTRAVENOUS | Status: DC | PRN

## 2018-02-06 MED ORDER — INSULIN ASPART PROT & ASPART (70-30 MIX) 100 UNIT/ML ~~LOC~~ SUSP: 5.0000 [IU] | Freq: Three times a day (TID) | SUBCUTANEOUS | Status: DC

## 2018-02-06 MED ORDER — DEXTROSE 50 % IV SOLN
INTRAVENOUS | Status: AC
  Filled 2018-02-06: qty 50

## 2018-02-06 MED ORDER — INSULIN ASPART 100 UNIT/ML ~~LOC~~ SOLN: 0.0000 [IU] | Freq: Three times a day (TID) | SUBCUTANEOUS | Status: DC

## 2018-02-06 MED ORDER — LEVOFLOXACIN IN D5W 750 MG/150ML IV SOLN: 750.0000 mg | INTRAVENOUS | Status: DC

## 2018-02-06 MED ORDER — OCUVITE-LUTEIN PO CAPS: 1.0000 | ORAL_CAPSULE | Freq: Every day | ORAL | Status: DC

## 2018-02-06 MED ORDER — KETOROLAC TROMETHAMINE 0.5 % OP SOLN: 1.0000 [drp] | OPHTHALMIC | Status: DC

## 2018-02-06 MED ORDER — DEXTROSE 50 % IV SOLN
25.0000 mL | Freq: Once | INTRAVENOUS | Status: AC
  Administered 2018-02-06: 25 mL via INTRAVENOUS

## 2018-02-06 MED ORDER — LEVOFLOXACIN IN D5W 500 MG/100ML IV SOLN
500.0000 mg | Freq: Once | INTRAVENOUS | Status: AC
  Administered 2018-02-06: 500 mg via INTRAVENOUS
  Filled 2018-02-06: qty 100

## 2018-02-06 MED ORDER — POTASSIUM CHLORIDE 10 MEQ/100ML IV SOLN
10.0000 meq | INTRAVENOUS | Status: AC
  Administered 2018-02-06 (×3): 10 meq via INTRAVENOUS
  Filled 2018-02-06 (×3): qty 100

## 2018-02-06 MED ORDER — POTASSIUM CHLORIDE CRYS ER 20 MEQ PO TBCR: 20.0000 meq | EXTENDED_RELEASE_TABLET | Freq: Every day | ORAL | 0 refills | Status: AC

## 2018-02-06 MED ORDER — MORPHINE SULFATE 15 MG PO TABS: 30.0000 mg | ORAL_TABLET | Freq: Three times a day (TID) | ORAL | Status: DC | PRN

## 2018-02-06 MED ORDER — IOPAMIDOL (ISOVUE-370) INJECTION 76%
100.0000 mL | Freq: Once | INTRAVENOUS | Status: AC | PRN
  Administered 2018-02-06: 100 mL via INTRAVENOUS

## 2018-02-06 MED ORDER — LEVOFLOXACIN 500 MG PO TABS: 500.0000 mg | ORAL_TABLET | Freq: Every day | ORAL | 0 refills | Status: DC

## 2018-02-06 MED ORDER — SODIUM CHLORIDE 0.9 % IV BOLUS
500.0000 mL | Freq: Once | INTRAVENOUS | Status: AC
  Administered 2018-02-06: 500 mL via INTRAVENOUS

## 2018-02-06 MED ORDER — POTASSIUM CHLORIDE CRYS ER 20 MEQ PO TBCR: 40.0000 meq | EXTENDED_RELEASE_TABLET | Freq: Once | ORAL | Status: DC

## 2018-02-06 NOTE — Discharge Summary (Signed)
Patient left AMA No charge

## 2018-02-06 NOTE — ED Notes (Signed)
Dr  Lita Mains in room.

## 2018-02-06 NOTE — ED Triage Notes (Signed)
Pt c/o of sob, intermittent cp and with confusion.  Pt has lung cancer but no treatment.  Pt's wife states he is c/o of lower back pain, incontinence, and weakness.

## 2018-02-06 NOTE — H&P (Signed)
History and Physical    Julian Woods FXT:[id number] DOB: 1946/01/08 DOA: 02/06/2018  PCP: System, Pcp Not In  Patient coming from: Home  Chief Complaint: Shortness of breath  HPI: Julian Woods is a 71 y.o. male with medical history significant of lung cancer metastatic being left untreated due to his personal choice comes in with several days of not eating very well worsening shortness of breath decreased p.o. intake and more confusion.  Wife is a Marine scientist who takes care of him at home.  Patient found to have pneumonia referred for admission for pneumonia.  However he does not want to stay in the hospital and is wanting to leave.  I have tried to discuss with him and his wife ways to make his stay more pleasant for him to stay for at least 24 hours and he absolutely refuses.  His wife will take him home if he decides to leave AMA.  But he is insisting on going home at this point.   Review of Systems: As per HPI otherwise 10 point review of systems negative per wife  Past Medical History:  Diagnosis Date  . Cataract   . Diabetes ([place])   . Loss of hearing   . Vision loss     Past Surgical History:  Procedure Laterality Date  . APPENDECTOMY    . CATARACT EXTRACTION  2017  . EYE SURGERY    . LUNG BIOPSY    . WRIST FRACTURE SURGERY       reports that he has been smoking.  He has never used smokeless tobacco. He reports that he does not drink alcohol or use drugs.  Allergies  Allergen Reactions  . Ventolin [Albuterol] Anaphylaxis    No family history on file.  No premature coronary artery disease Prior to Admission medications   Medication Sig Start Date End Date Taking? Authorizing Provider  ARTIFICIAL TEAR OP Apply 1 drop to eye daily.   Yes [provider]  insulin aspart (NOVOLOG FLEXPEN) 100 UNIT/ML FlexPen Inject into the skin 3 (three) times daily with meals. Per sliding scale   Yes [provider]  insulin aspart protamine- aspart (NOVOLOG MIX 70/30)  (70-30) 100 UNIT/ML injection Inject 5 Units into the skin 3 (three) times daily.   Yes [provider]  ketorolac (ACULAR) 0.5 % ophthalmic solution Place 1 drop into the left eye every 4 (four) hours.   Yes [provider]  morphine (MSIR) 30 MG tablet Take 30-60 mg by mouth every 8 (eight) hours as needed for moderate pain or severe pain.   Yes [provider]  multivitamin-lutein (OCUVITE-LUTEIN) CAPS capsule Take 1 capsule by mouth daily.   Yes [provider]  traMADol (ULTRAM) 50 MG tablet Take 100 mg by mouth 3 (three) times daily as needed for moderate pain.    Yes [provider]    Physical Exam: Vitals:   02/06/18 1630 02/06/18 1730 02/06/18 1752 02/06/18 1800  BP: 129/82 136/68  131/62  Pulse: 84 78 73 72  Resp: 17 18 (!) 8 10  Temp:      TempSrc:      SpO2: 97% 97% 100% 100%  Weight:      Height:          Constitutional: NAD, calm, comfortable Vitals:   02/06/18 1630 02/06/18 1730 02/06/18 1752 02/06/18 1800  BP: 129/82 136/68  131/62  Pulse: 84 78 73 72  Resp: 17 18 (!) 8 10  Temp:  TempSrc:      SpO2: 97% 97% 100% 100%  Weight:      Height:       Eyes: PERRL, lids and conjunctivae normal ENMT: Mucous membranes are moist. Posterior pharynx clear of any exudate or lesions.Normal dentition.  Neck: normal, supple, no masses, no thyromegaly Respiratory: clear to auscultation bilaterally, no wheezing, no crackles. Normal respiratory effort. No accessory muscle use.  Cardiovascular: Regular rate and rhythm, no murmurs / rubs / gallops. No extremity edema. 2+ pedal pulses. No carotid bruits.  Abdomen: no tenderness, no masses palpated. No hepatosplenomegaly. Bowel sounds positive.  Musculoskeletal: no clubbing / cyanosis. No joint deformity upper and lower extremities. Good ROM, no contractures. Normal muscle tone.  Skin: no rashes, lesions, ulcers. No induration Neurologic: CN 2-12 grossly intact. Sensation intact,  DTR normal. Strength 5/5 in all 4.  Psychiatric: Normal judgment and insight. Alert and oriented x 3. Normal mood.    Labs on Admission: I have personally reviewed following labs and imaging studies  CBC: Recent Labs  Lab 02/06/18 1300  WBC 14.5*  NEUTROABS 11.3*  HGB 13.4  HCT 38.7*  MCV 87.0  PLT 892   Basic Metabolic Panel: Recent Labs  Lab 02/06/18 1300  NA 138  K 2.5*  CL 96*  CO2 33*  GLUCOSE 85  BUN 21*  CREATININE 0.90  CALCIUM 9.9   GFR: Estimated Creatinine Clearance: 68.5 mL/min (by C-G formula based on SCr of 0.9 mg/dL). Liver Function Tests: Recent Labs  Lab 02/06/18 1300  AST 28  ALT 23  ALKPHOS 92  BILITOT 0.7  PROT 7.4  ALBUMIN 3.6   Recent Labs  Lab 02/06/18 1300  LIPASE 22   No results for input(s): AMMONIA in the last 168 hours. Coagulation Profile: No results for input(s): INR, PROTIME in the last 168 hours. Cardiac Enzymes: Recent Labs  Lab 02/06/18 1300  TROPONINI <0.03   BNP (last 3 results) No results for input(s): PROBNP in the last 8760 hours. HbA1C: No results for input(s): HGBA1C in the last 72 hours. CBG: No results for input(s): GLUCAP in the last 168 hours. Lipid Profile: No results for input(s): CHOL, HDL, LDLCALC, TRIG, CHOLHDL, LDLDIRECT in the last 72 hours. Thyroid Function Tests: No results for input(s): TSH, T4TOTAL, FREET4, T3FREE, THYROIDAB in the last 72 hours. Anemia Panel: No results for input(s): VITAMINB12, FOLATE, FERRITIN, TIBC, IRON, RETICCTPCT in the last 72 hours. Urine analysis:    Component Value Date/Time   COLORURINE YELLOW 02/06/2018 1325   APPEARANCEUR HAZY (A) 02/06/2018 1325   LABSPEC 1.029 02/06/2018 1325   PHURINE 7.0 02/06/2018 1325   GLUCOSEU NEGATIVE 02/06/2018 1325   HGBUR NEGATIVE 02/06/2018 1325   BILIRUBINUR MODERATE (A) 02/06/2018 1325   KETONESUR NEGATIVE 02/06/2018 1325   PROTEINUR 100 (A) 02/06/2018 1325   NITRITE NEGATIVE 02/06/2018 1325   LEUKOCYTESUR NEGATIVE  02/06/2018 1325   Sepsis Labs: !!!!!!!!!!!!!!!!!!!!!!!!!!!!!!!!!!!!!!!!!!!! @LABRCNTIP (procalcitonin:4,lacticidven:4) )No results found for this or any previous visit (from the past 240 hour(s)).   Radiological Exams on Admission: Dg Chest 2 View  Result Date: 02/06/2018 CLINICAL DATA:  Shortness of breath.  History of lung carcinoma EXAM: CHEST - 2 VIEW COMPARISON:  April 13, 2015 chest radiograph and chest CT April 13, 2015 FINDINGS: There is scarring in the left upper lobe anteriorly. No edema or consolidation evident. No demonstrable parenchymal lung mass. Heart size and pulmonary vascularity are normal. No adenopathy. No bone lesions. There is aortic atherosclerosis. IMPRESSION: Scarring anterior segment left upper lobe. No edema  or consolidation. No mass or adenopathy evident. There is aortic atherosclerosis. Aortic Atherosclerosis (ICD10-I70.0). Electronically Signed   By: Lowella Grip III M.D.   On: 02/06/2018 13:43   Ct Head Wo Contrast  Result Date: 02/06/2018 CLINICAL DATA:  Shortness of breath, chest pain, confusion EXAM: CT HEAD WITHOUT CONTRAST TECHNIQUE: Contiguous axial images were obtained from the base of the skull through the vertex without intravenous contrast. COMPARISON:  None. FINDINGS: Brain: No evidence of acute infarction, hemorrhage, extra-axial collection, ventriculomegaly, or mass effect. Mild cerebral atrophy. Vascular: Cerebrovascular atherosclerotic calcifications are noted. Skull: Negative for fracture or focal lesion. Sinuses/Orbits: Visualized portions of the orbits are unremarkable. Visualized portions of the paranasal sinuses and mastoid air cells are unremarkable. Other: None. IMPRESSION: No acute intracranial pathology. Electronically Signed   By: Kathreen Devoid   On: 02/06/2018 17:05   Ct Angio Chest Pe W And/or Wo Contrast  Result Date: 02/06/2018 CLINICAL DATA:  Shortness of breath, chest pain. History of lung cancer. EXAM: CT ANGIOGRAPHY CHEST WITH  CONTRAST TECHNIQUE: Multidetector CT imaging of the chest was performed using the standard protocol during bolus administration of intravenous contrast. Multiplanar CT image reconstructions and MIPs were obtained to evaluate the vascular anatomy. CONTRAST:  155mL ISOVUE-370 IOPAMIDOL (ISOVUE-370) INJECTION 76% COMPARISON:  Radiographs of same day.  CT scan of April 13, 2015. FINDINGS: Cardiovascular: Satisfactory opacification of the pulmonary arteries to the segmental level. No evidence of pulmonary embolism. Normal heart size. No pericardial effusion. Mild coronary artery calcifications are noted. Mediastinum/Nodes: Esophagus and thyroid gland are unremarkable. 3.6 x 2.1 cm lymph node is noted in aortopulmonary window. 17 mm right hilar lymph node is noted. 19 mm subcarinal adenopathy is noted. 11 mm right paratracheal lymph node is noted. Lungs/Pleura: No pneumothorax or pleural effusion is noted. Emphysematous disease is noted in both upper lobes. Right lower lobe opacity is noted concerning for pneumonia. Soft tissue density is noted in the bronchi of the right lower lobe concerning for aspirated material or mucous plugging. Irregular density is seen in area of mass noted on prior CT scan, with 11 x 10 mm mass now noted. Upper Abdomen: No acute abnormality. Musculoskeletal: No chest wall abnormality. No acute or significant osseous findings. Review of the MIP images confirms the above findings. IMPRESSION: No definite evidence of pulmonary embolus. Coronary artery calcifications are noted suggesting coronary artery disease. Significantly enlarged mediastinal adenopathy is noted concerning for metastatic disease or malignancy. Soft tissue material is noted in the bronchi of the right lower lobe concerning for aspirated material or mucous plugging. Right lower lobe basilar opacity is noted concerning for pneumonia or possibly atelectasis. Left upper lobe nodule or mass seen on prior exam is not well visualized  currently. There is now seen irregular densities in this area which may represent radiation fibrosis with small residual mass measuring 11 x 10 mm. Aortic Atherosclerosis (ICD10-I70.0) and Emphysema (ICD10-J43.9). Electronically Signed   By: Marijo Conception, M.D.   On: 02/06/2018 17:19    Old chart reviewed Case discussed with Dr. Lita Mains   Assessment/Plan 72 year old male with end-stage lung cancer left untreated comes in with dehydration hypokalemia pneumonia Principal Problem:   Hypokalemia-replete IV n.p.o. and recheck BMP in the morning  Active Problems:   PNA (pneumonia) IV Levaquin-blood cultures and sputum cultures pending    Acute respiratory failure with hypoxia (HCC)-improved on 2 L of oxygen    Acute metabolic encephalopathy-this is been deteriorating over the last several weeks and getting worse  Dehydration-IV fluids    SOB (shortness of breath)-treat pneumonia patient does not have oxygen set up at home    Lung cancer (HCC)-noted      DVT prophylaxis: SCDs Code Status: DNR Family Communication: Wife Disposition Plan: Per day team Consults called: None Admission status: Admission unless the patient decides to leave AMA with his wife   Zandria Woldt A MD [facility]  If 7PM-7AM, please contact night-coverage [email or web address] Password [facility]  02/06/2018, 6:29 PM

## 2018-02-06 NOTE — ED Provider Notes (Signed)
[facility] EMERGENCY DEPARTMENT Provider Note   CSN: [id number] Arrival date & time: 02/06/18  1225     History   Chief Complaint Chief Complaint  Patient presents with  . Altered Mental Status  . Shortness of Breath    HPI Julian Woods is a 72 y.o. male.  HPI Patient has history of lung cancer for which she is not undergoing any treatment.  Wife states he has had progressive shortness of breath but minimal cough.  Has noted increased lower extremity swelling.  Wife is also concerned for increased confusion.  Patient gets confused about what medications he is taken.  Also had 2 bouts of bowel incontinence over the last 2 days.  No bloody or melanotic stool.  Patient has ongoing upper abdominal pain which he attributes to his chronic pancreatitis.  No nausea or vomiting.  Patient does state he has had decreased appetite over the last few days especially.  Decreased p.o. intake. Past Medical History:  Diagnosis Date  . Cataract   . Diabetes ([place])   . Loss of hearing   . Vision loss     Patient Active Problem List   Diagnosis Date Noted  . PNA (pneumonia) 02/06/2018  . Acute metabolic encephalopathy 02/54/2706  . Hypokalemia 02/06/2018  . Dehydration 02/06/2018  . SOB (shortness of breath) 02/06/2018  . Lung cancer ([place]) 02/06/2018  . Acute respiratory failure with hypoxia ([place]) 02/06/2018  . Community acquired pneumonia of right lower lobe of lung [facility])     Past Surgical History:  Procedure Laterality Date  . APPENDECTOMY    . CATARACT EXTRACTION  2017  . EYE SURGERY    . LUNG BIOPSY    . WRIST FRACTURE SURGERY          Home Medications    Prior to Admission medications   Medication Sig Start Date End Date Taking? Authorizing Provider  ARTIFICIAL TEAR OP Apply 1 drop to eye daily.   Yes [provider]  insulin aspart (NOVOLOG FLEXPEN) 100 UNIT/ML FlexPen Inject into the skin 3 (three) times daily with meals. Per sliding scale   Yes [provider]  insulin aspart protamine- aspart (NOVOLOG MIX 70/30) (70-30) 100 UNIT/ML injection Inject 5 Units into the skin 3 (three) times daily.   Yes [provider]  ketorolac (ACULAR) 0.5 % ophthalmic solution Place 1 drop into the left eye every 4 (four) hours.   Yes [provider]  morphine (MSIR) 30 MG tablet Take 30-60 mg by mouth every 8 (eight) hours as needed for moderate pain or severe pain.   Yes [provider]  multivitamin-lutein (OCUVITE-LUTEIN) CAPS capsule Take 1 capsule by mouth daily.   Yes [provider]  traMADol (ULTRAM) 50 MG tablet Take 100 mg by mouth 3 (three) times daily as needed for moderate pain.    Yes [provider]  levofloxacin (LEVAQUIN) 500 MG tablet Take 1 tablet (500 mg total) by mouth daily. 02/07/18   Julianne Rice, MD  potassium chloride SA (K-DUR,KLOR-CON) 20 MEQ tablet Take 1 tablet (20 mEq total) by mouth daily. 02/07/18   Julianne Rice, MD    Family History No family history on file.  Social History Social History   Tobacco Use  . Smoking status: Current Every Day Smoker  . Smokeless tobacco: Never Used  Substance Use Topics  . Alcohol use: Never    Frequency: Never  . Drug use: Never     Allergies   Ventolin [albuterol]  Review of Systems Review of Systems  Constitutional: Positive for appetite change. Negative for chills and fever.  HENT: Negative for congestion, sinus pressure, sore throat and trouble swallowing.   Eyes: Negative for visual disturbance.  Respiratory: Positive for shortness of breath. Negative for cough.   Cardiovascular: Positive for leg swelling. Negative for chest pain and palpitations.  Gastrointestinal: Positive for abdominal pain. Negative for constipation, diarrhea, nausea and vomiting.  Genitourinary: Negative for dysuria, flank pain and frequency.  Musculoskeletal: Negative for back pain, myalgias and neck pain.  Skin: Negative for rash and  wound.  Neurological: Negative for dizziness, speech difficulty, weakness, light-headedness, numbness and headaches.  Psychiatric/Behavioral: Positive for confusion.  All other systems reviewed and are negative.    Physical Exam Updated Vital Signs BP (!) 142/95   Pulse (!) 55   Temp 97.9 F (36.6 C) (Oral)   Resp 19   Ht 5\' 7"  (1.702 m)   Wt 65.3 kg (144 lb)   SpO2 99%   BMI 22.55 kg/m   Physical Exam  Constitutional: He is oriented to person, place, and time. He appears well-developed and well-nourished. No distress.  HENT:  Head: Normocephalic and atraumatic.  Dry mucous membranes.  No obvious head trauma.  Midface is stable.  Eyes: Pupils are equal, round, and reactive to light. EOM are normal.  Neck: Normal range of motion. Neck supple.  No posterior midline cervical tenderness to palpation.  Cardiovascular: Normal rate and regular rhythm. Exam reveals no gallop and no friction rub.  No murmur heard. Pulmonary/Chest: Effort normal.  Rhonchi and left lung fields.  Decreased lung sounds in the right base.  Abdominal: Soft. Bowel sounds are normal. There is tenderness. There is no rebound and no guarding.  Mild left upper quadrant tenderness to palpation.  Mild distention.  No rebound or guarding.  Musculoskeletal: Normal range of motion. He exhibits no edema or tenderness.  1+ bilateral lower extremity edema.  No calf swelling or asymmetry.  Distal pulses intact.  Neurological: He is alert and oriented to person, place, and time.  Patient is alert and oriented x3 with clear, goal oriented speech.  Patient does have some confusion and short-term memory deficits.  Patient has 5/5 motor in all extremities. Sensation is intact to light touch. Bilateral finger-to-nose is normal with no signs of dysmetria.   Skin: Skin is warm and dry. Capillary refill takes less than 2 seconds. No rash noted. He is not diaphoretic. No erythema.  Psychiatric: He has a normal mood and affect. His  behavior is normal.  Nursing note and vitals reviewed.    ED Treatments / Results  Labs (all labs ordered are listed, but only abnormal results are displayed) Labs Reviewed  CBC WITH DIFFERENTIAL/PLATELET - Abnormal; Notable for the following components:      Result Value   WBC 14.5 (*)    HCT 38.7 (*)    Neutro Abs 11.3 (*)    All other components within normal limits  COMPREHENSIVE METABOLIC PANEL - Abnormal; Notable for the following components:   Potassium 2.5 (*)    Chloride 96 (*)    CO2 33 (*)    BUN 21 (*)    All other components within normal limits  URINALYSIS, ROUTINE W REFLEX MICROSCOPIC - Abnormal; Notable for the following components:   APPearance HAZY (*)    Bilirubin Urine MODERATE (*)    Protein, ur 100 (*)    All other components within normal limits  CBG MONITORING, ED - Abnormal;  Notable for the following components:   Glucose-Capillary 175 (*)    All other components within normal limits  CULTURE, BLOOD (ROUTINE X 2)  CULTURE, BLOOD (ROUTINE X 2)  CULTURE, EXPECTORATED SPUTUM-ASSESSMENT  GRAM STAIN  LIPASE, BLOOD  TROPONIN I  BRAIN NATRIURETIC PEPTIDE  HIV ANTIBODY (ROUTINE TESTING)  STREP PNEUMONIAE URINARY ANTIGEN  BASIC METABOLIC PANEL  CBC WITH DIFFERENTIAL/PLATELET    EKG None  Radiology Dg Chest 2 View  Result Date: 02/06/2018 CLINICAL DATA:  Shortness of breath.  History of lung carcinoma EXAM: CHEST - 2 VIEW COMPARISON:  April 13, 2015 chest radiograph and chest CT April 13, 2015 FINDINGS: There is scarring in the left upper lobe anteriorly. No edema or consolidation evident. No demonstrable parenchymal lung mass. Heart size and pulmonary vascularity are normal. No adenopathy. No bone lesions. There is aortic atherosclerosis. IMPRESSION: Scarring anterior segment left upper lobe. No edema or consolidation. No mass or adenopathy evident. There is aortic atherosclerosis. Aortic Atherosclerosis (ICD10-I70.0). Electronically Signed   By: Lowella Grip III M.D.   On: 02/06/2018 13:43   Ct Head Wo Contrast  Result Date: 02/06/2018 CLINICAL DATA:  Shortness of breath, chest pain, confusion EXAM: CT HEAD WITHOUT CONTRAST TECHNIQUE: Contiguous axial images were obtained from the base of the skull through the vertex without intravenous contrast. COMPARISON:  None. FINDINGS: Brain: No evidence of acute infarction, hemorrhage, extra-axial collection, ventriculomegaly, or mass effect. Mild cerebral atrophy. Vascular: Cerebrovascular atherosclerotic calcifications are noted. Skull: Negative for fracture or focal lesion. Sinuses/Orbits: Visualized portions of the orbits are unremarkable. Visualized portions of the paranasal sinuses and mastoid air cells are unremarkable. Other: None. IMPRESSION: No acute intracranial pathology. Electronically Signed   By: Kathreen Devoid   On: 02/06/2018 17:05   Ct Angio Chest Pe W And/or Wo Contrast  Result Date: 02/06/2018 CLINICAL DATA:  Shortness of breath, chest pain. History of lung cancer. EXAM: CT ANGIOGRAPHY CHEST WITH CONTRAST TECHNIQUE: Multidetector CT imaging of the chest was performed using the standard protocol during bolus administration of intravenous contrast. Multiplanar CT image reconstructions and MIPs were obtained to evaluate the vascular anatomy. CONTRAST:  134mL ISOVUE-370 IOPAMIDOL (ISOVUE-370) INJECTION 76% COMPARISON:  Radiographs of same day.  CT scan of April 13, 2015. FINDINGS: Cardiovascular: Satisfactory opacification of the pulmonary arteries to the segmental level. No evidence of pulmonary embolism. Normal heart size. No pericardial effusion. Mild coronary artery calcifications are noted. Mediastinum/Nodes: Esophagus and thyroid gland are unremarkable. 3.6 x 2.1 cm lymph node is noted in aortopulmonary window. 17 mm right hilar lymph node is noted. 19 mm subcarinal adenopathy is noted. 11 mm right paratracheal lymph node is noted. Lungs/Pleura: No pneumothorax or pleural effusion is noted.  Emphysematous disease is noted in both upper lobes. Right lower lobe opacity is noted concerning for pneumonia. Soft tissue density is noted in the bronchi of the right lower lobe concerning for aspirated material or mucous plugging. Irregular density is seen in area of mass noted on prior CT scan, with 11 x 10 mm mass now noted. Upper Abdomen: No acute abnormality. Musculoskeletal: No chest wall abnormality. No acute or significant osseous findings. Review of the MIP images confirms the above findings. IMPRESSION: No definite evidence of pulmonary embolus. Coronary artery calcifications are noted suggesting coronary artery disease. Significantly enlarged mediastinal adenopathy is noted concerning for metastatic disease or malignancy. Soft tissue material is noted in the bronchi of the right lower lobe concerning for aspirated material or mucous plugging. Right lower lobe basilar opacity is  noted concerning for pneumonia or possibly atelectasis. Left upper lobe nodule or mass seen on prior exam is not well visualized currently. There is now seen irregular densities in this area which may represent radiation fibrosis with small residual mass measuring 11 x 10 mm. Aortic Atherosclerosis (ICD10-I70.0) and Emphysema (ICD10-J43.9). Electronically Signed   By: Marijo Conception, M.D.   On: 02/06/2018 17:19    Procedures Procedures (including critical care time)  Medications Ordered in ED Medications  traMADol (ULTRAM) tablet 100 mg (has no administration in time range)  insulin aspart protamine- aspart (NOVOLOG MIX 70/30) injection 5 Units (has no administration in time range)  morphine (MSIR) tablet 30-60 mg (has no administration in time range)  ketorolac (ACULAR) 0.5 % ophthalmic solution 1 drop (has no administration in time range)  multivitamin-lutein (OCUVITE-LUTEIN) capsule 1 capsule (has no administration in time range)  sodium chloride flush (NS) 0.9 % injection 3 mL (has no administration in time  range)  sodium chloride flush (NS) 0.9 % injection 3 mL (has no administration in time range)  0.9 %  sodium chloride infusion (has no administration in time range)  levofloxacin (LEVAQUIN) IVPB 750 mg (has no administration in time range)  insulin aspart (novoLOG) injection 0-9 Units (has no administration in time range)  potassium chloride SA (K-DUR,KLOR-CON) CR tablet 40 mEq (has no administration in time range)  potassium chloride 10 mEq in 100 mL IVPB (0 mEq Intravenous Stopped 02/06/18 1919)  sodium chloride 0.9 % bolus 500 mL (0 mLs Intravenous Stopped 02/06/18 1629)  dextrose 50 % solution 25 mL (25 mLs Intravenous Given 02/06/18 1605)  iopamidol (ISOVUE-370) 76 % injection 100 mL (100 mLs Intravenous Contrast Given 02/06/18 1641)  levofloxacin (LEVAQUIN) IVPB 500 mg (500 mg Intravenous New Bag/Given 02/06/18 1918)     Initial Impression / Assessment and Plan / ED Course  I have reviewed the triage vital signs and the nursing notes.  Pertinent labs & imaging results that were available during my care of the patient were reviewed by me and considered in my medical decision making (see chart for details).     CT angios chest with right lower lobe infiltrate.  No evidence of PE.  Patient does have mild elevation in white blood cell count.  Concerning for community acquired pneumonia.  Also initiated potassium replacement in the emergency department.  Discussed with hospitalist will see patient and admit. Hospitalist to see's patient but patient refused admission.  Understands the risks of leaving prior to completion of work-up and treatment.  He is awake and alert and appears to be competent to make medical decisions.  He will follow-up with his [facility].  We will give prescription for complete course of antibiotics and several days of potassium replacement.  Return precautions have been given. Final Clinical Impressions(s) / ED Diagnoses   Final diagnoses:  Community acquired pneumonia  of right lower lobe of lung ([place])  Hypokalemia    ED Discharge Orders        Ordered    levofloxacin (LEVAQUIN) 500 MG tablet  Daily     02/06/18 2026    potassium chloride SA (K-DUR,KLOR-CON) 20 MEQ tablet  Daily     02/06/18 2026       Julianne Rice, MD 02/06/18 2029

## 2018-02-06 NOTE — ED Notes (Signed)
Wife and family request to see Dr Lita Mains regarding code status.

## 2018-02-06 NOTE — ED Notes (Signed)
CRITICAL VALUE ALERT  Critical Value:  Potassium 2.5  Date & Time Notied:  02/06/18 @ 1410  Provider Notified: Dr Lita Mains  Orders Received/Actions taken: orders to be given

## 2018-02-08 ENCOUNTER — Emergency Department (HOSPITAL_COMMUNITY): Payer: Medicare Other

## 2018-02-08 ENCOUNTER — Observation Stay (HOSPITAL_COMMUNITY)
Admission: EM | Admit: 2018-02-08 | Discharge: 2018-02-09 | Disposition: A | Discharge status: Home / Self Care | Payer: Medicare Other | Attending: Internal Medicine | Admitting: Internal Medicine

## 2018-02-08 ENCOUNTER — Other Ambulatory Visit: Payer: Self-pay

## 2018-02-08 ENCOUNTER — Encounter (HOSPITAL_COMMUNITY): Payer: Self-pay | Admitting: Emergency Medicine

## 2018-02-08 DIAGNOSIS — F17200 Nicotine dependence, unspecified, uncomplicated: Secondary | ICD-10-CM | POA: Insufficient documentation

## 2018-02-08 DIAGNOSIS — G9341 Metabolic encephalopathy: Secondary | ICD-10-CM | POA: Diagnosis present

## 2018-02-08 DIAGNOSIS — C3490 Malignant neoplasm of unspecified part of unspecified bronchus or lung: Secondary | ICD-10-CM | POA: Diagnosis not present

## 2018-02-08 DIAGNOSIS — G9340 Encephalopathy, unspecified: Secondary | ICD-10-CM | POA: Diagnosis present

## 2018-02-08 DIAGNOSIS — E876 Hypokalemia: Secondary | ICD-10-CM | POA: Insufficient documentation

## 2018-02-08 DIAGNOSIS — Z79899 Other long term (current) drug therapy: Secondary | ICD-10-CM | POA: Insufficient documentation

## 2018-02-08 DIAGNOSIS — J181 Lobar pneumonia, unspecified organism: Principal | ICD-10-CM | POA: Insufficient documentation

## 2018-02-08 DIAGNOSIS — Z794 Long term (current) use of insulin: Secondary | ICD-10-CM | POA: Insufficient documentation

## 2018-02-08 DIAGNOSIS — R0602 Shortness of breath: Secondary | ICD-10-CM | POA: Diagnosis present

## 2018-02-08 DIAGNOSIS — E119 Type 2 diabetes mellitus without complications: Secondary | ICD-10-CM

## 2018-02-08 DIAGNOSIS — J189 Pneumonia, unspecified organism: Secondary | ICD-10-CM | POA: Diagnosis present

## 2018-02-08 LAB — CBC WITH DIFFERENTIAL/PLATELET
Basophils Absolute: 0 10*3/uL (ref 0.0–0.1)
Basophils Relative: 0 %
EOS PCT: 1 %
Eosinophils Absolute: 0.1 10*3/uL (ref 0.0–0.7)
HCT: 38.7 % — ABNORMAL LOW (ref 39.0–52.0)
Hemoglobin: 13.4 g/dL (ref 13.0–17.0)
LYMPHS ABS: 2 10*3/uL (ref 0.7–4.0)
Lymphocytes Relative: 15 %
MCH: 30.2 pg (ref 26.0–34.0)
MCHC: 34.6 g/dL (ref 30.0–36.0)
MCV: 87.2 fL (ref 78.0–100.0)
Monocytes Absolute: 0.9 10*3/uL (ref 0.1–1.0)
Monocytes Relative: 7 %
Neutro Abs: 10.5 10*3/uL — ABNORMAL HIGH (ref 1.7–7.7)
Neutrophils Relative %: 77 %
Platelets: 310 10*3/uL (ref 150–400)
RBC: 4.44 MIL/uL (ref 4.22–5.81)
RDW: 12.8 % (ref 11.5–15.5)
WBC: 13.5 10*3/uL — AB (ref 4.0–10.5)

## 2018-02-08 LAB — URINALYSIS, ROUTINE W REFLEX MICROSCOPIC
BILIRUBIN URINE: NEGATIVE
Bacteria, UA: NONE SEEN
Glucose, UA: NEGATIVE mg/dL
KETONES UR: NEGATIVE mg/dL
LEUKOCYTES UA: NEGATIVE
NITRITE: NEGATIVE
PH: 7 (ref 5.0–8.0)
Protein, ur: 30 mg/dL — AB
SPECIFIC GRAVITY, URINE: 1.011 (ref 1.005–1.030)

## 2018-02-08 LAB — I-STAT CG4 LACTIC ACID, ED: LACTIC ACID, VENOUS: 1.23 mmol/L (ref 0.5–1.9)

## 2018-02-08 LAB — HIV ANTIBODY (ROUTINE TESTING W REFLEX): HIV Screen 4th Generation wRfx: NONREACTIVE

## 2018-02-08 LAB — BASIC METABOLIC PANEL
Anion gap: 8 (ref 5–15)
BUN: 12 mg/dL (ref 6–20)
CALCIUM: 8.8 mg/dL — AB (ref 8.9–10.3)
CO2: 30 mmol/L (ref 22–32)
CREATININE: 0.78 mg/dL (ref 0.61–1.24)
Chloride: 96 mmol/L — ABNORMAL LOW (ref 101–111)
GFR calc Af Amer: >60 mL/min (ref >60)
Glucose, Bld: 199 mg/dL — ABNORMAL HIGH (ref 65–99)
POTASSIUM: 2.6 mmol/L — AB (ref 3.5–5.1)
SODIUM: 134 mmol/L — AB (ref 135–145)

## 2018-02-08 LAB — MAGNESIUM: Magnesium: 1.5 mg/dL — ABNORMAL LOW (ref 1.7–2.4)

## 2018-02-08 LAB — STREP PNEUMONIAE URINARY ANTIGEN: Strep Pneumo Urinary Antigen: NEGATIVE

## 2018-02-08 LAB — HEPATIC FUNCTION PANEL
ALK PHOS: 89 U/L (ref 38–126)
ALT: 21 U/L (ref 17–63)
AST: 23 U/L (ref 15–41)
Albumin: 3.6 g/dL (ref 3.5–5.0)
BILIRUBIN DIRECT: 0.1 mg/dL (ref 0.1–0.5)
BILIRUBIN TOTAL: 0.7 mg/dL (ref 0.3–1.2)
Indirect Bilirubin: 0.6 mg/dL (ref 0.3–0.9)
Total Protein: 7.4 g/dL (ref 6.5–8.1)

## 2018-02-08 LAB — TROPONIN I

## 2018-02-08 LAB — GLUCOSE, CAPILLARY
GLUCOSE-CAPILLARY: 186 mg/dL — AB (ref 65–99)
GLUCOSE-CAPILLARY: 144 mg/dL — AB (ref 65–99)
GLUCOSE-CAPILLARY: 153 mg/dL — AB (ref 65–99)
Glucose-Capillary: 198 mg/dL — ABNORMAL HIGH (ref 65–99)

## 2018-02-08 MED ORDER — ASPIRIN EC 81 MG PO TBEC
81.0000 mg | DELAYED_RELEASE_TABLET | Freq: Every day | ORAL | Status: DC
  Administered 2018-02-08 – 2018-02-09 (×2): 81 mg via ORAL
  Filled 2018-02-08 (×2): qty 1

## 2018-02-08 MED ORDER — SODIUM CHLORIDE 0.9 % IV SOLN
500.0000 mg | INTRAVENOUS | Status: DC
  Administered 2018-02-08 – 2018-02-09 (×2): 500 mg via INTRAVENOUS
  Filled 2018-02-08 (×3): qty 500

## 2018-02-08 MED ORDER — IPRATROPIUM-ALBUTEROL 0.5-2.5 (3) MG/3ML IN SOLN: 3.0000 mL | Freq: Four times a day (QID) | RESPIRATORY_TRACT | Status: DC | PRN

## 2018-02-08 MED ORDER — BISMUTH SUBSALICYLATE 262 MG/15ML PO SUSP
30.0000 mL | Freq: Four times a day (QID) | ORAL | Status: DC | PRN
  Administered 2018-02-08 – 2018-02-09 (×2): 30 mL via ORAL
  Filled 2018-02-08: qty 118

## 2018-02-08 MED ORDER — POTASSIUM CHLORIDE CRYS ER 20 MEQ PO TBCR
40.0000 meq | EXTENDED_RELEASE_TABLET | Freq: Once | ORAL | Status: AC
  Administered 2018-02-08: 40 meq via ORAL
  Filled 2018-02-08: qty 2

## 2018-02-08 MED ORDER — SODIUM CHLORIDE 0.9 % IV BOLUS
1000.0000 mL | Freq: Once | INTRAVENOUS | Status: AC
  Administered 2018-02-08: 1000 mL via INTRAVENOUS

## 2018-02-08 MED ORDER — INSULIN ASPART 100 UNIT/ML ~~LOC~~ SOLN: 0.0000 [IU] | Freq: Every day | SUBCUTANEOUS | Status: DC

## 2018-02-08 MED ORDER — ACETAMINOPHEN 650 MG RE SUPP: 650.0000 mg | Freq: Four times a day (QID) | RECTAL | Status: DC | PRN

## 2018-02-08 MED ORDER — POTASSIUM CHLORIDE 10 MEQ/100ML IV SOLN
10.0000 meq | INTRAVENOUS | Status: AC
  Administered 2018-02-08 (×3): 10 meq via INTRAVENOUS
  Filled 2018-02-08 (×4): qty 100

## 2018-02-08 MED ORDER — MORPHINE SULFATE 15 MG PO TABS
30.0000 mg | ORAL_TABLET | Freq: Three times a day (TID) | ORAL | Status: DC | PRN
  Administered 2018-02-08 – 2018-02-09 (×3): 30 mg via ORAL
  Filled 2018-02-08 (×4): qty 2

## 2018-02-08 MED ORDER — MAGNESIUM SULFATE 2 GM/50ML IV SOLN
2.0000 g | Freq: Once | INTRAVENOUS | Status: AC
  Administered 2018-02-08: 2 g via INTRAVENOUS
  Filled 2018-02-08: qty 50

## 2018-02-08 MED ORDER — GLUCERNA SHAKE PO LIQD
237.0000 mL | Freq: Three times a day (TID) | ORAL | Status: DC
  Administered 2018-02-08 (×3): 237 mL via ORAL

## 2018-02-08 MED ORDER — ONDANSETRON HCL 4 MG PO TABS: 4.0000 mg | ORAL_TABLET | Freq: Four times a day (QID) | ORAL | Status: DC | PRN

## 2018-02-08 MED ORDER — POTASSIUM CHLORIDE CRYS ER 20 MEQ PO TBCR
40.0000 meq | EXTENDED_RELEASE_TABLET | ORAL | Status: AC
  Administered 2018-02-08 – 2018-02-09 (×2): 40 meq via ORAL
  Filled 2018-02-08 (×2): qty 2

## 2018-02-08 MED ORDER — ONDANSETRON HCL 4 MG/2ML IJ SOLN: 4.0000 mg | Freq: Four times a day (QID) | INTRAMUSCULAR | Status: DC | PRN

## 2018-02-08 MED ORDER — ENOXAPARIN SODIUM 40 MG/0.4ML ~~LOC~~ SOLN
40.0000 mg | SUBCUTANEOUS | Status: DC
  Administered 2018-02-09: 40 mg via SUBCUTANEOUS
  Filled 2018-02-08: qty 0.4

## 2018-02-08 MED ORDER — GUAIFENESIN-DM 100-10 MG/5ML PO SYRP: 5.0000 mL | ORAL_SOLUTION | ORAL | Status: DC | PRN

## 2018-02-08 MED ORDER — ZOLPIDEM TARTRATE 5 MG PO TABS
5.0000 mg | ORAL_TABLET | Freq: Every evening | ORAL | Status: DC | PRN
  Administered 2018-02-08: 5 mg via ORAL
  Filled 2018-02-08: qty 1

## 2018-02-08 MED ORDER — ACETAMINOPHEN 325 MG PO TABS: 650.0000 mg | ORAL_TABLET | Freq: Four times a day (QID) | ORAL | Status: DC | PRN

## 2018-02-08 MED ORDER — OCUVITE-LUTEIN PO CAPS
1.0000 | ORAL_CAPSULE | Freq: Every day | ORAL | Status: DC
  Administered 2018-02-08 – 2018-02-09 (×2): 1 via ORAL
  Filled 2018-02-08 (×2): qty 1

## 2018-02-08 MED ORDER — POTASSIUM CHLORIDE CRYS ER 20 MEQ PO TBCR
20.0000 meq | EXTENDED_RELEASE_TABLET | Freq: Every day | ORAL | Status: DC
  Administered 2018-02-08 – 2018-02-09 (×2): 20 meq via ORAL
  Filled 2018-02-08 (×2): qty 1

## 2018-02-08 MED ORDER — SODIUM CHLORIDE 0.9 % IV SOLN
2.0000 g | INTRAVENOUS | Status: DC
  Administered 2018-02-08 – 2018-02-09 (×2): 2 g via INTRAVENOUS
  Filled 2018-02-08 (×3): qty 20

## 2018-02-08 MED ORDER — INSULIN ASPART 100 UNIT/ML ~~LOC~~ SOLN
0.0000 [IU] | Freq: Three times a day (TID) | SUBCUTANEOUS | Status: DC
  Administered 2018-02-08: 2 [IU] via SUBCUTANEOUS
  Administered 2018-02-08 (×2): 3 [IU] via SUBCUTANEOUS
  Administered 2018-02-09: 5 [IU] via SUBCUTANEOUS
  Administered 2018-02-09: 2 [IU] via SUBCUTANEOUS

## 2018-02-08 MED ORDER — SODIUM CHLORIDE 0.9 % IV SOLN
INTRAVENOUS | Status: AC
  Administered 2018-02-08 (×2): via INTRAVENOUS

## 2018-02-08 MED ORDER — POTASSIUM CHLORIDE 10 MEQ/100ML IV SOLN
10.0000 meq | Freq: Once | INTRAVENOUS | Status: AC
  Administered 2018-02-08: 10 meq via INTRAVENOUS

## 2018-02-08 MED ORDER — TRAMADOL HCL 50 MG PO TABS
100.0000 mg | ORAL_TABLET | Freq: Three times a day (TID) | ORAL | Status: DC | PRN
  Administered 2018-02-08 – 2018-02-09 (×3): 100 mg via ORAL
  Filled 2018-02-08 (×3): qty 2

## 2018-02-08 NOTE — ED Triage Notes (Addendum)
Pt c/o increased sob and pain all over. Pt was going to be admitted for pneumonia yesterday but left ama.

## 2018-02-08 NOTE — ED Notes (Signed)
Patient transported to X-ray 

## 2018-02-08 NOTE — ED Provider Notes (Signed)
[facility] EMERGENCY DEPARTMENT Provider Note   CSN: [id number] Arrival date & time: 02/08/18  0446     History   Chief Complaint Chief Complaint  Patient presents with  . Shortness of Breath    HPI Julian Woods is a 72 y.o. male.  The history is provided by the patient.  He has history of diabetes, lung cancer, MIs in emergency yesterday with pneumonia and scheduled for admission but left AGAINST MEDICAL ADVICE.  He had a low potassium, and was given prescription for potassium, but did not get the prescription filled.  He had been taking some potassium he had at home which was a different dose and had expired.  He woke up this morning with sweats, with worsening difficulty breathing, and sharp lower sternal chest pain which he rates a 10/10.  Nothing makes it better, nothing makes it worse.  There has been no nausea or vomiting.  He had not noticed any chills.  He is not having any active treatment of his cancer.  Past Medical History:  Diagnosis Date  . Cataract   . Diabetes ([place])   . Loss of hearing   . Vision loss     Patient Active Problem List   Diagnosis Date Noted  . PNA (pneumonia) 02/06/2018  . Acute metabolic encephalopathy 37/[id number]/[id number]  . Hypokalemia 02/06/2018  . Dehydration 02/06/2018  . SOB (shortness of breath) 02/06/2018  . Lung cancer ([place]) 02/06/2018  . Acute respiratory failure with hypoxia ([place]) 02/06/2018  . Community acquired pneumonia of right lower lobe of lung [facility])     Past Surgical History:  Procedure Laterality Date  . APPENDECTOMY    . CATARACT EXTRACTION  2017  . EYE SURGERY    . LUNG BIOPSY    . WRIST FRACTURE SURGERY          Home Medications    Prior to Admission medications   Medication Sig Start Date End Date Taking? Authorizing Provider  ARTIFICIAL TEAR OP Apply 1 drop to eye daily.    [provider]  insulin aspart (NOVOLOG FLEXPEN) 100 UNIT/ML FlexPen Inject into the skin 3 (three) times daily with meals.  Per sliding scale    [provider]  insulin aspart protamine- aspart (NOVOLOG MIX 70/30) (70-30) 100 UNIT/ML injection Inject 5 Units into the skin 3 (three) times daily.    [provider]  ketorolac (ACULAR) 0.5 % ophthalmic solution Place 1 drop into the left eye every 4 (four) hours.    [provider]  levofloxacin (LEVAQUIN) 500 MG tablet Take 1 tablet (500 mg total) by mouth daily. 02/07/18   Julianne Rice, MD  morphine (MSIR) 30 MG tablet Take 30-60 mg by mouth every 8 (eight) hours as needed for moderate pain or severe pain.    [provider]  multivitamin-lutein (OCUVITE-LUTEIN) CAPS capsule Take 1 capsule by mouth daily.    [provider]  potassium chloride SA (K-DUR,KLOR-CON) 20 MEQ tablet Take 1 tablet (20 mEq total) by mouth daily. 02/07/18   Julianne Rice, MD  traMADol (ULTRAM) 50 MG tablet Take 100 mg by mouth 3 (three) times daily as needed for moderate pain.     [provider]    Family History No family history on file.  Social History Social History   Tobacco Use  . Smoking status: Current Every Day Smoker  . Smokeless tobacco: Never Used  Substance Use Topics  . Alcohol use: Never    Frequency: Never  . Drug use:  Never     Allergies   Ventolin [albuterol]   Review of Systems Review of Systems  All other systems reviewed and are negative.    Physical Exam Updated Vital Signs BP (!) 157/78   Pulse 78   Temp 98 F (36.7 C)   Resp 19   Ht 5\' 7"  (1.702 m)   Wt 65.3 kg (144 lb)   SpO2 96%   BMI 22.55 kg/m   Physical Exam  Nursing note and vitals reviewed.  72 year old male, resting comfortably and in no acute distress. Vital signs are significant for elevated systolic blood pressure. Oxygen saturation is 96%, which is normal. Head is normocephalic and atraumatic. PERRLA, EOMI. Oropharynx is clear. Neck is nontender and supple without adenopathy or JVD. Back is nontender and there is  no CVA tenderness. Lungs rales at the right base without wheezes or rhonchi. Chest is nontender. Heart has regular rate and rhythm without murmur. Abdomen is soft, flat, nontender without masses or hepatosplenomegaly and peristalsis is normoactive. Extremities have no cyanosis or edema, full range of motion is present. Skin is warm and dry without rash. Neurologic: Mental status is normal, cranial nerves are intact, there are no motor or sensory deficits.  ED Treatments / Results  Labs (all labs ordered are listed, but only abnormal results are displayed) Labs Reviewed  CBC WITH DIFFERENTIAL/PLATELET - Abnormal; Notable for the following components:      Result Value   WBC 13.5 (*)    HCT 38.7 (*)    Neutro Abs 10.5 (*)    All other components within normal limits  CULTURE, BLOOD (ROUTINE X 2)  CULTURE, BLOOD (ROUTINE X 2)  BASIC METABOLIC PANEL  URINALYSIS, ROUTINE W REFLEX MICROSCOPIC  HEPATIC FUNCTION PANEL  TROPONIN I  I-STAT CG4 LACTIC ACID, ED    EKG EKG Interpretation  Date/Time:  Wednesday Feb 08 2018 05:01:40 EDT Ventricular Rate:  78 PR Interval:    QRS Duration: 83 QT Interval:  379 QTC Calculation: 432 R Axis:   69 Text Interpretation:  Sinus rhythm Atrial premature complex Otherwise within normal limits No old tracing to compare Confirmed by Delora Fuel ([id number]) on 02/08/2018 5:17:01 AM   Radiology Dg Chest 2 View  Result Date: 02/06/2018 CLINICAL DATA:  Shortness of breath.  History of lung carcinoma EXAM: CHEST - 2 VIEW COMPARISON:  April 13, 2015 chest radiograph and chest CT April 13, 2015 FINDINGS: There is scarring in the left upper lobe anteriorly. No edema or consolidation evident. No demonstrable parenchymal lung mass. Heart size and pulmonary vascularity are normal. No adenopathy. No bone lesions. There is aortic atherosclerosis. IMPRESSION: Scarring anterior segment left upper lobe. No edema or consolidation. No mass or adenopathy evident. There is  aortic atherosclerosis. Aortic Atherosclerosis (ICD10-I70.0). Electronically Signed   By: Lowella Grip III M.D.   On: 02/06/2018 13:43   Ct Head Wo Contrast  Result Date: 02/06/2018 CLINICAL DATA:  Shortness of breath, chest pain, confusion EXAM: CT HEAD WITHOUT CONTRAST TECHNIQUE: Contiguous axial images were obtained from the base of the skull through the vertex without intravenous contrast. COMPARISON:  None. FINDINGS: Brain: No evidence of acute infarction, hemorrhage, extra-axial collection, ventriculomegaly, or mass effect. Mild cerebral atrophy. Vascular: Cerebrovascular atherosclerotic calcifications are noted. Skull: Negative for fracture or focal lesion. Sinuses/Orbits: Visualized portions of the orbits are unremarkable. Visualized portions of the paranasal sinuses and mastoid air cells are unremarkable. Other: None. IMPRESSION: No acute intracranial pathology. Electronically Signed   By: Elbert Ewings  Patel   On: 02/06/2018 17:05   Ct Angio Chest Pe W And/or Wo Contrast  Result Date: 02/06/2018 CLINICAL DATA:  Shortness of breath, chest pain. History of lung cancer. EXAM: CT ANGIOGRAPHY CHEST WITH CONTRAST TECHNIQUE: Multidetector CT imaging of the chest was performed using the standard protocol during bolus administration of intravenous contrast. Multiplanar CT image reconstructions and MIPs were obtained to evaluate the vascular anatomy. CONTRAST:  167mL ISOVUE-370 IOPAMIDOL (ISOVUE-370) INJECTION 76% COMPARISON:  Radiographs of same day.  CT scan of April 13, 2015. FINDINGS: Cardiovascular: Satisfactory opacification of the pulmonary arteries to the segmental level. No evidence of pulmonary embolism. Normal heart size. No pericardial effusion. Mild coronary artery calcifications are noted. Mediastinum/Nodes: Esophagus and thyroid gland are unremarkable. 3.6 x 2.1 cm lymph node is noted in aortopulmonary window. 17 mm right hilar lymph node is noted. 19 mm subcarinal adenopathy is noted. 11 mm  right paratracheal lymph node is noted. Lungs/Pleura: No pneumothorax or pleural effusion is noted. Emphysematous disease is noted in both upper lobes. Right lower lobe opacity is noted concerning for pneumonia. Soft tissue density is noted in the bronchi of the right lower lobe concerning for aspirated material or mucous plugging. Irregular density is seen in area of mass noted on prior CT scan, with 11 x 10 mm mass now noted. Upper Abdomen: No acute abnormality. Musculoskeletal: No chest wall abnormality. No acute or significant osseous findings. Review of the MIP images confirms the above findings. IMPRESSION: No definite evidence of pulmonary embolus. Coronary artery calcifications are noted suggesting coronary artery disease. Significantly enlarged mediastinal adenopathy is noted concerning for metastatic disease or malignancy. Soft tissue material is noted in the bronchi of the right lower lobe concerning for aspirated material or mucous plugging. Right lower lobe basilar opacity is noted concerning for pneumonia or possibly atelectasis. Left upper lobe nodule or mass seen on prior exam is not well visualized currently. There is now seen irregular densities in this area which may represent radiation fibrosis with small residual mass measuring 11 x 10 mm. Aortic Atherosclerosis (ICD10-I70.0) and Emphysema (ICD10-J43.9). Electronically Signed   By: Marijo Conception, M.D.   On: 02/06/2018 17:19    Procedures Procedures (including critical care time)  Medications Ordered in ED Medications  cefTRIAXone (ROCEPHIN) 2 g in sodium chloride 0.9 % 100 mL IVPB (0 g Intravenous Stopped 02/08/18 0650)  azithromycin (ZITHROMAX) 500 mg in sodium chloride 0.9 % 250 mL IVPB (500 mg Intravenous New Bag/Given 02/08/18 0739)  potassium chloride 10 mEq in 100 mL IVPB (10 mEq Intravenous New Bag/Given 02/08/18 0740)  multivitamin-lutein (OCUVITE-LUTEIN) capsule 1 capsule (has no administration in time range)  potassium  chloride SA (K-DUR,KLOR-CON) CR tablet 20 mEq (has no administration in time range)  traMADol (ULTRAM) tablet 100 mg (has no administration in time range)  enoxaparin (LOVENOX) injection 40 mg (has no administration in time range)  0.9 %  sodium chloride infusion ( Intravenous New Bag/Given 02/08/18 0741)  acetaminophen (TYLENOL) tablet 650 mg (has no administration in time range)    Or  acetaminophen (TYLENOL) suppository 650 mg (has no administration in time range)  ondansetron (ZOFRAN) tablet 4 mg (has no administration in time range)    Or  ondansetron (ZOFRAN) injection 4 mg (has no administration in time range)  insulin aspart (novoLOG) injection 0-15 Units (has no administration in time range)  insulin aspart (novoLOG) injection 0-5 Units (has no administration in time range)  ipratropium-albuterol (DUONEB) 0.5-2.5 (3) MG/3ML nebulizer solution 3 mL (has  no administration in time range)  guaiFENesin-dextromethorphan (ROBITUSSIN DM) 100-10 MG/5ML syrup 5 mL (has no administration in time range)  magnesium sulfate IVPB 2 g 50 mL (has no administration in time range)  sodium chloride 0.9 % bolus 1,000 mL (0 mLs Intravenous Stopped 02/08/18 0741)  potassium chloride SA (K-DUR,KLOR-CON) CR tablet 40 mEq (40 mEq Oral Given 02/08/18 0602)     Initial Impression / Assessment and Plan / ED Course  I have reviewed the triage vital signs and the nursing notes.  Pertinent labs & imaging results that were available during my care of the patient were reviewed by me and considered in my medical decision making (see chart for details).  Community-acquired pneumonia which has failed attempted outpatient management.  Old records are reviewed confirming ED visit yesterday with intent to admit for pneumonia.  Pneumonia was not seen on plain x-ray, but visible on CT scan.  Of note, I heard clear rales at at the bases which were not noted by either the ED physician or admitting physician.  Sepsis labs are  obtained, the patient is not clinically septic.  Will be placed on antibiotics for community-acquired pneumonia.  Yesterday, he had been started on levofloxacin.  Here, he is given ceftriaxone and azithromycin.  WBC is slightly decreased compared with yesterday.  At this point, metabolic panel is pending and lactic acid is pending.  Lactic acid is normal.  He is noted to be severely hypokalemic, and IV and oral potassium was ordered.  Because of hypokalemia, magnesium level was checked and is also noted to be low and he is given intravenous magnesium.  Chest x-ray shows questionable start of left lower lobe infiltrate as well as previously known right lower lobe infiltrate.  Case is discussed with Dr. Manuella Ghazi of [facility], who agrees to admit the patient.  Final Clinical Impressions(s) / ED Diagnoses   Final diagnoses:  Community acquired pneumonia of right lower lobe of lung ([place])  Hypokalemia  Hypomagnesemia    ED Discharge Orders    None       Delora Fuel, MD 35/45/62 [phone]

## 2018-02-08 NOTE — Care Management Obs Status (Signed)
[facility] NOTIFICATION   Patient Details  Name: Julian Woods MRN: [id number] Date of Birth: 1945/10/21   Medicare Observation Status Notification Given:  Yes    Sherald Barge, RN 02/08/2018, 11:01 AM

## 2018-02-08 NOTE — ED Notes (Signed)
Date and time results received: 02/08/18 0541   Test: Potassium Critical Value: 2.6  Name of Provider Notified: EDP, Roxanne Mins  Orders Received? Or Actions Taken?: n/a

## 2018-02-08 NOTE — Progress Notes (Signed)
Patient seen and examined, database reviewed.  Discussed with daughter at bedside.  Patient was admitted earlier today due to acute metabolic encephalopathy and shortness of breath.  This is presumably related to CAP and dehydration.  He has responded well to IV fluids and Levaquin and is close to baseline mental status.  Would like his chronic morphine resumed.  He takes this for chronic back pain and metastatic lung cancer.  Continue IV antibiotics today.  If continues to do well we will anticipate discharge home over next 24 hours.  Will continue to follow.  Domingo Mend, MD [facility] Pager: [phone]

## 2018-02-08 NOTE — Progress Notes (Signed)
Patient complaining of his HR being elevated. Verified with Central monitoring that his HR has been normal. Explained to patient it is his BP that is elevated. Patient is also complaining of insomnia. Mid -level had been notified and orders received. Explained to patient that bed alarm will be turned on due to fall  precautions. Patient verbalized understanding.

## 2018-02-08 NOTE — H&P (Signed)
History and Physical    CHASKE PASKETT QIO:[id number] DOB: August 30, 1946 DOA: 02/08/2018  PCP: System, Pcp Not In   Patient coming from: Home  Chief Complaint: Dyspnea/confusion  HPI: Julian Woods is a 72 y.o. male with medical history significant for diabetes and metastatic lung cancer who presented to the emergency department on 5/27 with complaints of shortness of breath and was noted to be more confused with decreased oral intake.  He was given some Levaquin at that time but then decided to leave AMA.  He presents back today with worsening confusion and ongoing shortness of breath as well as some diaphoresis.  His wife who is a Marine scientist, is most concerned about his confusion.  He denies any particular aggravating or alleviating factors and states that he is also having some mild low back pain. He denies any abdominal pain, nausea, or vomiting but has had some mild diarrhea at home. He has had some fevers and chills with diaphoresis noted. Denies any chest pain, palpitations, lightheadedness, or dizziness.   ED Course: Vital signs are currently stable and patient is noted to be saturating in the 90th percentile on room air.  He does not appear to be in any respiratory distress but is quite confused.  Labs demonstrate leukocytosis of 13,500 (improved from 14,500 yesterday) and lactic acid level of 1.23.  Potassium is 2.6, which was 2.5 yesterday, and glucose is noted to be 199.  Imaging has been reviewed from prior ED visit with no significant abnormalities aside from findings of right lower lobe pneumonia.  He is noted to have mediastinal adenopathy and left upper lobe nodule on CT angiogram of the chest.  CT of the head with no acute findings.  Urinalysis within normal limits.  Review of Systems: Difficult to obtain as patient is currently confused, but otherwise as above.  Past Medical History:  Diagnosis Date  . Cataract   . Diabetes ([place])   . Loss of hearing   . Vision loss     Past  Surgical History:  Procedure Laterality Date  . APPENDECTOMY    . CATARACT EXTRACTION  2017  . EYE SURGERY    . LUNG BIOPSY    . WRIST FRACTURE SURGERY       reports that he has been smoking.  He has never used smokeless tobacco. He reports that he does not drink alcohol or use drugs.  Allergies  Allergen Reactions  . Ventolin [Albuterol] Anaphylaxis    No family history on file.  Prior to Admission medications   Medication Sig Start Date End Date Taking? Authorizing Provider  ARTIFICIAL TEAR OP Apply 1 drop to eye daily.    [provider]  insulin aspart (NOVOLOG FLEXPEN) 100 UNIT/ML FlexPen Inject into the skin 3 (three) times daily with meals. Per sliding scale    [provider]  insulin aspart protamine- aspart (NOVOLOG MIX 70/30) (70-30) 100 UNIT/ML injection Inject 5 Units into the skin 3 (three) times daily.    [provider]  ketorolac (ACULAR) 0.5 % ophthalmic solution Place 1 drop into the left eye every 4 (four) hours.    [provider]  levofloxacin (LEVAQUIN) 500 MG tablet Take 1 tablet (500 mg total) by mouth daily. 02/07/18   Julianne Rice, MD  morphine (MSIR) 30 MG tablet Take 30-60 mg by mouth every 8 (eight) hours as needed for moderate pain or severe pain.    [provider]  multivitamin-lutein (OCUVITE-LUTEIN) CAPS capsule Take 1 capsule by mouth  daily.    [provider]  potassium chloride SA (K-DUR,KLOR-CON) 20 MEQ tablet Take 1 tablet (20 mEq total) by mouth daily. 02/07/18   Julianne Rice, MD  traMADol (ULTRAM) 50 MG tablet Take 100 mg by mouth 3 (three) times daily as needed for moderate pain.     [provider]    Physical Exam: Vitals:   02/08/18 0459 02/08/18 0501 02/08/18 0515 02/08/18 0530  BP: (!) 157/78 (!) 157/78  140/67  Pulse:  79 78 78  Resp:  16 19 15   Temp: 98 F (36.7 C)     SpO2: 95% 98% 96% 95%  Weight:      Height:        Constitutional: NAD,  confused. Vitals:   02/08/18 0459 02/08/18 0501 02/08/18 0515 02/08/18 0530  BP: (!) 157/78 (!) 157/78  140/67  Pulse:  79 78 78  Resp:  16 19 15   Temp: 98 F (36.7 C)     SpO2: 95% 98% 96% 95%  Weight:      Height:       Eyes: lids and conjunctivae normal ENMT: Mucous membranes are moist.  Neck: normal, supple Respiratory: clear to auscultation bilaterally. Normal respiratory effort. No accessory muscle use.  Cardiovascular: Regular rate and rhythm, no murmurs. No extremity edema. Abdomen: no tenderness, no distention. Bowel sounds positive.  Musculoskeletal:  No joint deformity upper and lower extremities.   Skin: no rashes, lesions, ulcers.   Labs on Admission: I have personally reviewed following labs and imaging studies  CBC: Recent Labs  Lab 02/06/18 1300 02/08/18 0509  WBC 14.5* 13.5*  NEUTROABS 11.3* 10.5*  HGB 13.4 13.4  HCT 38.7* 38.7*  MCV 87.0 87.2  PLT 343 213   Basic Metabolic Panel: Recent Labs  Lab 02/06/18 1300 02/08/18 0509  NA 138 134*  K 2.5* 2.6*  CL 96* 96*  CO2 33* 30  GLUCOSE 85 199*  BUN 21* 12  CREATININE 0.90 0.78  CALCIUM 9.9 8.8*   GFR: Estimated Creatinine Clearance: 77.1 mL/min (by C-G formula based on SCr of 0.78 mg/dL). Liver Function Tests: Recent Labs  Lab 02/06/18 1300 02/08/18 0509  AST 28 23  ALT 23 21  ALKPHOS 92 89  BILITOT 0.7 0.7  PROT 7.4 7.4  ALBUMIN 3.6 3.6   Recent Labs  Lab 02/06/18 1300  LIPASE 22   No results for input(s): AMMONIA in the last 168 hours. Coagulation Profile: No results for input(s): INR, PROTIME in the last 168 hours. Cardiac Enzymes: Recent Labs  Lab 02/06/18 1300 02/08/18 0509  TROPONINI <0.03 <0.03   BNP (last 3 results) No results for input(s): PROBNP in the last 8760 hours. HbA1C: No results for input(s): HGBA1C in the last 72 hours. CBG: Recent Labs  Lab 02/06/18 1927  GLUCAP 175*   Lipid Profile: No results for input(s): CHOL, HDL, LDLCALC, TRIG, CHOLHDL,  LDLDIRECT in the last 72 hours. Thyroid Function Tests: No results for input(s): TSH, T4TOTAL, FREET4, T3FREE, THYROIDAB in the last 72 hours. Anemia Panel: No results for input(s): VITAMINB12, FOLATE, FERRITIN, TIBC, IRON, RETICCTPCT in the last 72 hours. Urine analysis:    Component Value Date/Time   COLORURINE YELLOW 02/06/2018 1325   APPEARANCEUR HAZY (A) 02/06/2018 1325   LABSPEC 1.029 02/06/2018 1325   PHURINE 7.0 02/06/2018 1325   GLUCOSEU NEGATIVE 02/06/2018 1325   HGBUR NEGATIVE 02/06/2018 1325   BILIRUBINUR MODERATE (A) 02/06/2018 1325   KETONESUR NEGATIVE 02/06/2018 1325   PROTEINUR 100 (A) 02/06/2018 1325  NITRITE NEGATIVE 02/06/2018 1325   LEUKOCYTESUR NEGATIVE 02/06/2018 1325    Radiological Exams on Admission: Dg Chest 2 View  Result Date: 02/06/2018 CLINICAL DATA:  Shortness of breath.  History of lung carcinoma EXAM: CHEST - 2 VIEW COMPARISON:  April 13, 2015 chest radiograph and chest CT April 13, 2015 FINDINGS: There is scarring in the left upper lobe anteriorly. No edema or consolidation evident. No demonstrable parenchymal lung mass. Heart size and pulmonary vascularity are normal. No adenopathy. No bone lesions. There is aortic atherosclerosis. IMPRESSION: Scarring anterior segment left upper lobe. No edema or consolidation. No mass or adenopathy evident. There is aortic atherosclerosis. Aortic Atherosclerosis (ICD10-I70.0). Electronically Signed   By: Lowella Grip III M.D.   On: 02/06/2018 13:43   Ct Head Wo Contrast  Result Date: 02/06/2018 CLINICAL DATA:  Shortness of breath, chest pain, confusion EXAM: CT HEAD WITHOUT CONTRAST TECHNIQUE: Contiguous axial images were obtained from the base of the skull through the vertex without intravenous contrast. COMPARISON:  None. FINDINGS: Brain: No evidence of acute infarction, hemorrhage, extra-axial collection, ventriculomegaly, or mass effect. Mild cerebral atrophy. Vascular: Cerebrovascular atherosclerotic  calcifications are noted. Skull: Negative for fracture or focal lesion. Sinuses/Orbits: Visualized portions of the orbits are unremarkable. Visualized portions of the paranasal sinuses and mastoid air cells are unremarkable. Other: None. IMPRESSION: No acute intracranial pathology. Electronically Signed   By: Kathreen Devoid   On: 02/06/2018 17:05   Ct Angio Chest Pe W And/or Wo Contrast  Result Date: 02/06/2018 CLINICAL DATA:  Shortness of breath, chest pain. History of lung cancer. EXAM: CT ANGIOGRAPHY CHEST WITH CONTRAST TECHNIQUE: Multidetector CT imaging of the chest was performed using the standard protocol during bolus administration of intravenous contrast. Multiplanar CT image reconstructions and MIPs were obtained to evaluate the vascular anatomy. CONTRAST:  123mL ISOVUE-370 IOPAMIDOL (ISOVUE-370) INJECTION 76% COMPARISON:  Radiographs of same day.  CT scan of April 13, 2015. FINDINGS: Cardiovascular: Satisfactory opacification of the pulmonary arteries to the segmental level. No evidence of pulmonary embolism. Normal heart size. No pericardial effusion. Mild coronary artery calcifications are noted. Mediastinum/Nodes: Esophagus and thyroid gland are unremarkable. 3.6 x 2.1 cm lymph node is noted in aortopulmonary window. 17 mm right hilar lymph node is noted. 19 mm subcarinal adenopathy is noted. 11 mm right paratracheal lymph node is noted. Lungs/Pleura: No pneumothorax or pleural effusion is noted. Emphysematous disease is noted in both upper lobes. Right lower lobe opacity is noted concerning for pneumonia. Soft tissue density is noted in the bronchi of the right lower lobe concerning for aspirated material or mucous plugging. Irregular density is seen in area of mass noted on prior CT scan, with 11 x 10 mm mass now noted. Upper Abdomen: No acute abnormality. Musculoskeletal: No chest wall abnormality. No acute or significant osseous findings. Review of the MIP images confirms the above findings.  IMPRESSION: No definite evidence of pulmonary embolus. Coronary artery calcifications are noted suggesting coronary artery disease. Significantly enlarged mediastinal adenopathy is noted concerning for metastatic disease or malignancy. Soft tissue material is noted in the bronchi of the right lower lobe concerning for aspirated material or mucous plugging. Right lower lobe basilar opacity is noted concerning for pneumonia or possibly atelectasis. Left upper lobe nodule or mass seen on prior exam is not well visualized currently. There is now seen irregular densities in this area which may represent radiation fibrosis with small residual mass measuring 11 x 10 mm. Aortic Atherosclerosis (ICD10-I70.0) and Emphysema (ICD10-J43.9). Electronically Signed   By:  Marijo Conception, M.D.   On: 02/06/2018 17:19    EKG: Independently reviewed. NSR 78bpm.  Assessment/Plan Principal Problem:   Acute metabolic encephalopathy Active Problems:   Lung cancer ([place])   Community acquired pneumonia of right lower lobe of lung ([place])   Diabetes ([place])    1. Acute metabolic encephalopathy secondary to pneumonia and hypokalemia.  Continue to monitor for improvement with treatment and maintain on neurochecks. 2. Hypokalemia.  Likely secondary to some ongoing diarrhea with poor oral intake.  Being given 40 IV and 40 oral of potassium in ED and magnesium will be checked.  Will monitor on telemetry until this improves.  Repeat labs in a.m. 3. Right lower lobe pneumonia.  Maintain on azithromycin and Rocephin.  Duo nebs as needed for shortness of breath or wheezing.  Urine strep pneumo and Legionella.  Robitussin as needed for cough. 4. Diabetes-insulin-dependent.  Maintain on sliding scale insulin and initiate home 70/30 insulin when patient is noted to be reliably eating. 5. History of metastatic lung cancer.  Patient elects to be untreated due to personal choice.   DVT prophylaxis: Lovenox Code Status: DNR Family  Communication: Wife at bedside Disposition Plan:Treatment of pneumonia; DC once symptomatically improved Consults called:None Admission status: Obs, tele   Tierria Watson Darleen Crocker DO [facility] Pager [phone]  If 7PM-7AM, please contact night-coverage [email or web address] Password [email or web address]  02/08/2018, 6:08 AM

## 2018-02-09 DIAGNOSIS — J181 Lobar pneumonia, unspecified organism: Secondary | ICD-10-CM | POA: Diagnosis not present

## 2018-02-09 DIAGNOSIS — G9341 Metabolic encephalopathy: Secondary | ICD-10-CM | POA: Diagnosis not present

## 2018-02-09 LAB — BASIC METABOLIC PANEL
ANION GAP: 8 (ref 5–15)
BUN: 8 mg/dL (ref 6–20)
CALCIUM: 8.3 mg/dL — AB (ref 8.9–10.3)
CO2: 25 mmol/L (ref 22–32)
Chloride: 103 mmol/L (ref 101–111)
Creatinine, Ser: 0.66 mg/dL (ref 0.61–1.24)
GFR calc Af Amer: >60 mL/min (ref >60)
GLUCOSE: 183 mg/dL — AB (ref 65–99)
POTASSIUM: 4 mmol/L (ref 3.5–5.1)
SODIUM: 136 mmol/L (ref 135–145)

## 2018-02-09 LAB — CBC
HEMATOCRIT: 36.5 % — AB (ref 39.0–52.0)
Hemoglobin: 12.4 g/dL — ABNORMAL LOW (ref 13.0–17.0)
MCH: 29.7 pg (ref 26.0–34.0)
MCHC: 34 g/dL (ref 30.0–36.0)
MCV: 87.5 fL (ref 78.0–100.0)
Platelets: 299 10*3/uL (ref 150–400)
RBC: 4.17 MIL/uL — ABNORMAL LOW (ref 4.22–5.81)
RDW: 12.9 % (ref 11.5–15.5)
WBC: 11.2 10*3/uL — AB (ref 4.0–10.5)

## 2018-02-09 LAB — GLUCOSE, CAPILLARY
GLUCOSE-CAPILLARY: 208 mg/dL — AB (ref 65–99)
Glucose-Capillary: 142 mg/dL — ABNORMAL HIGH (ref 65–99)

## 2018-02-09 LAB — LEGIONELLA PNEUMOPHILA SEROGP 1 UR AG: L. PNEUMOPHILA SEROGP 1 UR AG: NEGATIVE

## 2018-02-09 LAB — MAGNESIUM: Magnesium: 1.6 mg/dL — ABNORMAL LOW (ref 1.7–2.4)

## 2018-02-09 MED ORDER — LEVOFLOXACIN 500 MG PO TABS: 500.0000 mg | ORAL_TABLET | Freq: Every day | ORAL | 0 refills | Status: AC

## 2018-02-09 NOTE — Progress Notes (Signed)
Patient wife called to desk and requested to speak to charge nurse.  Patient wife states that patient has called her and stated that his heart rate has been in the 170s and he cannot receive any medication for his pulse.  Also patient states that the hospital phone in his room is not working properly. Patient wife also requests that patient have something for anxiety.  Explained to patient wife that patient was on  telemetry and CCMD has not notified charge nurse or patient nurse of any tachycardias or arrthymias. Went to room with nurse, and patient was his cell phone with wife.  Explained to patient how to use hospital phone and patient nurse would call and attempt to get something to help him rest, although explained to patient that he would have to be in the bed and bed alarm would be turned on for his safety.  Patient agreeable to being in bed with bed alarm.

## 2018-02-09 NOTE — Progress Notes (Addendum)
Initial Nutrition Assessment  DOCUMENTATION CODES:   Not applicable  INTERVENTION:  Monitor PO intake and weight fluctuations.  Continue glucerna TID  Continue daily multivitamin  NUTRITION DIAGNOSIS:   Unintentional weight loss related to decreased appetite as evidenced by percent weight loss, per patient/family report.  GOAL:   Patient will meet greater than or equal to 90% of their needs  MONITOR:   PO intake, Supplement acceptance, Weight trends  REASON FOR ASSESSMENT:   Malnutrition Screening Tool    ASSESSMENT:  72 y/o male with Hx of DM2, metastatic lung cancer, cataracts, and loss of hearing.  Admitted with dyspnea and confusion.  MST score of 2.  Pt very eager to talk to [place] intern.  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 They have been eating out a lot more lately. They usually eat three meals/day: breakfast is usually eaten out - eggs, bacon, biscuits/gravy, grits, etc.  Pt mentioned a sandwich and veggies for other meals. Pt doesnt drink any ONS at home, but has been taking vit E, C, A, and CoQ10 for 50+ years.  He checks his blood sugar at home and says the range is usually around 150-200, sometimes up to 225, even first thing in the morning.  He checks his blood sugar and takes his insulin regularly, but does not follow any sort of diet to manage DM2.  Pt said that if his blood sugar is 100-125, he feels drained of energy; [facility] doctor told him to just keep his blood sugar below 200.  Pt says he is still very physically active lifting weights and doing a lot of work around his house.  Pt reported wt loss over the past 3-4 months.  Pt says his appetite has not been good over the past 3-4 months, and that this is the reason for this wt loss. Says UBW has been 165-170 lbs for most of adult life.  CBW is 143 lbs.  Hospital records do not have wt hx, but if pt reported time frame is correct, this is a 13% wt loss over 3-4 months, which is clinically significant for  severe malnutrition.   Nutrition focused exam does not show any significant degree of fat/muscle loss-  mild depletion around temples and perhaps some on thigh, but may just be how appears.   At this time, pt states his appetite has not been doing well since he has been in the hospital, however, later he states his wife has been bringing him outside fast food which he has been eating. Intake documentation shows pt eating >60% meals.   Patient displays some confusion, at times contradicting himself, and it is interns clinical impression that pt has been eating well. Patient has discharge orders. No interventions at this time.   Labs reviewed: Glucose 183, Mg 1.6 Recent Labs  Lab 02/06/18 1300 02/08/18 0509 02/09/18 0502  NA 138 134* 136  K 2.5* 2.6* 4.0  CL 96* 96* 103  CO2 33* 30 25  BUN 21* 12 8  CREATININE 0.90 0.78 0.66  CALCIUM 9.9 8.8* 8.3*  MG  --  1.5* 1.6*  GLUCOSE 85 199* 183*   Medications: glucerna TID, MVI, insulin, potassium chloride, rocephin, pepto bismol, ultram    Past Medical History:  Diagnosis Date  . Cataract   . Diabetes ([place])   . Loss of hearing   . Vision loss    NUTRITION - FOCUSED PHYSICAL EXAM:    Most Recent Value  Orbital Region  No depletion  Upper Arm Region  No depletion  Temple Region  Mild depletion  Clavicle Bone Region  No depletion  Dorsal Hand  No depletion  Patellar Region  No depletion  Anterior Thigh Region  No depletion [perhaps mild depletion: hard to tell if depletion or just pt's body type]  Posterior Calf Region  No depletion     Diet Order:   Diet Order           Diet Carb Modified Fluid consistency: Thin; Room service appropriate? Yes  Diet effective now          EDUCATION NEEDS:   No education needs have been identified at this time  Skin:  Skin Assessment: Reviewed RN Assessment  Last BM:  5/29  Height:   Ht Readings from Last 1 Encounters:  02/08/18 5\' 7"  (1.702 m)   Weight:   Wt Readings from  Last 1 Encounters:  02/08/18 143 lb 0.2 oz (64.9 kg)   Ideal Body Weight:  67.1 kg  BMI:  Body mass index is 22.4 kg/m.  Estimated Nutritional Needs:   Kcal:  1620-1950 kcal (25-30 kcal/kg BW)  Protein:  78-97 g (1.2-1.5 g/kg BW or 20% kcal range)   Fluid:  >/=1620 mL (>/=25 mL/kg BW)

## 2018-02-09 NOTE — Discharge Summary (Signed)
Physician Discharge Summary  Julian Woods PYK:[id number] DOB: 1945-12-24 DOA: 02/08/2018  PCP: System, Pcp Not In  Admit date: 02/08/2018 Discharge date: 02/09/2018  Time spent: 45 minutes  Recommendations for Outpatient Follow-up:  -To be discharged home today. -To complete a 7-day course of Levaquin for community-acquired pneumonia. -Advised follow-up with primary care provider in 2 weeks.  Discharge Diagnoses:  Principal Problem:   Acute metabolic encephalopathy Active Problems:   Lung cancer ([place])   Community acquired pneumonia of right lower lobe of lung ([place])   Diabetes ([place])   Encephalopathy acute   Discharge Condition: Stable and improved    Filed Weights   02/08/18 0458 02/08/18 0830  Weight: 65.3 kg (144 lb) 64.9 kg (143 lb 0.2 oz)    History of present illness:  As per Dr. Manuella Ghazi on 5/29: Julian Woods is a 72 y.o. male with medical history significant for diabetes and metastatic lung cancer who presented to the emergency department on 5/27 with complaints of shortness of breath and was noted to be more confused with decreased oral intake.  He was given some Levaquin at that time but then decided to leave AMA.  He presents back today with worsening confusion and ongoing shortness of breath as well as some diaphoresis.  His wife who is a Marine scientist, is most concerned about his confusion.  He denies any particular aggravating or alleviating factors and states that he is also having some mild low back pain. He denies any abdominal pain, nausea, or vomiting but has had some mild diarrhea at home. He has had some fevers and chills with diaphoresis noted. Denies any chest pain, palpitations, lightheadedness, or dizziness.   ED Course: Vital signs are currently stable and patient is noted to be saturating in the 90th percentile on room air.  He does not appear to be in any respiratory distress but is quite confused.  Labs demonstrate leukocytosis of 13,500 (improved from  14,500 yesterday) and lactic acid level of 1.23.  Potassium is 2.6, which was 2.5 yesterday, and glucose is noted to be 199.  Imaging has been reviewed from prior ED visit with no significant abnormalities aside from findings of right lower lobe pneumonia.  He is noted to have mediastinal adenopathy and left upper lobe nodule on CT angiogram of the chest.  CT of the head with no acute findings.  Urinalysis within normal limits.     Hospital Course:   Acute metabolic encephalopathy -Completely resolved, per wife at baseline. -Presumed secondary to pneumonia.  Community-acquired pneumonia -Culture data remains negative on discharge. -Will DC on Levaquin for 7 days.  History of metastatic lung cancer -To follow-up with his oncologist at the [place].  Hypokalemia -Replaced.  Procedures:  None   Consultations:  None  Discharge Instructions  Discharge Instructions    Diet - low sodium heart healthy   Complete by:  As directed    Increase activity slowly   Complete by:  As directed      Allergies as of 02/09/2018      Reactions   Ventolin [albuterol] Anaphylaxis      Medication List    TAKE these medications   acetaminophen 325 MG tablet Commonly known as:  TYLENOL Take 650 mg by mouth every 6 (six) hours as needed for mild pain or moderate pain.   ARTIFICIAL TEAR OP Apply 1 drop to eye daily.   aspirin EC 81 MG tablet Take 81 mg by mouth daily.   insulin aspart protamine-  aspart (70-30) 100 UNIT/ML injection Commonly known as:  NOVOLOG MIX 70/30 Inject 5 Units into the skin 3 (three) times daily.   ketorolac 0.5 % ophthalmic solution Commonly known as:  ACULAR Place 1 drop into the left eye every 4 (four) hours.   levofloxacin 500 MG tablet Commonly known as:  LEVAQUIN Take 1 tablet (500 mg total) by mouth daily for 7 days.   morphine 30 MG tablet Commonly known as:  MSIR Take 30-60 mg by mouth every 8 (eight) hours as needed for moderate pain or severe  pain.   multivitamin-lutein Caps capsule Take 1 capsule by mouth daily.   NOVOLOG FLEXPEN 100 UNIT/ML FlexPen Generic drug:  insulin aspart Inject into the skin 3 (three) times daily with meals. Per sliding scale   PEPTO-BISMOL 262 MG/15ML suspension Generic drug:  bismuth subsalicylate Take 30 mLs by mouth every 6 (six) hours as needed (stomach).   potassium chloride SA 20 MEQ tablet Commonly known as:  K-DUR,KLOR-CON Take 1 tablet (20 mEq total) by mouth daily.   traMADol 50 MG tablet Commonly known as:  ULTRAM Take 100 mg by mouth 3 (three) times daily as needed for moderate pain.      Allergies  Allergen Reactions  . Ventolin [Albuterol] Anaphylaxis   Follow-up Information    with your PCP at the [place]. Schedule an appointment as soon as possible for a visit in 2 week(s).            The results of significant diagnostics from this hospitalization (including imaging, microbiology, ancillary and laboratory) are listed below for reference.    Significant Diagnostic Studies: Dg Chest 2 View  Result Date: 02/08/2018 CLINICAL DATA:  Acute onset of shortness of breath and generalized chest pain. Known left-sided lung cancer with metastases. EXAM: CHEST - 2 VIEW COMPARISON:  Chest radiograph and CTA of the chest performed 02/06/2018 FINDINGS: Known right lower lobe pneumonia is better characterized on recent CTA. New left basilar airspace opacity may also reflect pneumonia. The residual spiculated nodule at the left upper lobe is partially characterized. No significant pleural effusion or pneumothorax is seen. The cardiomediastinal silhouette is normal in size. No acute osseous abnormalities are identified. IMPRESSION: 1. Previously noted right lower lobe pneumonia is better characterized on recent CTA. New left basilar airspace opacity may also reflect pneumonia. 2. Known residual spiculated nodule at the left upper lobe is partially characterized. Electronically Signed   By:  Garald Balding M.D.   On: 02/08/2018 06:10   Dg Chest 2 View  Result Date: 02/06/2018 CLINICAL DATA:  Shortness of breath.  History of lung carcinoma EXAM: CHEST - 2 VIEW COMPARISON:  April 13, 2015 chest radiograph and chest CT April 13, 2015 FINDINGS: There is scarring in the left upper lobe anteriorly. No edema or consolidation evident. No demonstrable parenchymal lung mass. Heart size and pulmonary vascularity are normal. No adenopathy. No bone lesions. There is aortic atherosclerosis. IMPRESSION: Scarring anterior segment left upper lobe. No edema or consolidation. No mass or adenopathy evident. There is aortic atherosclerosis. Aortic Atherosclerosis (ICD10-I70.0). Electronically Signed   By: Lowella Grip III M.D.   On: 02/06/2018 13:43   Ct Head Wo Contrast  Result Date: 02/06/2018 CLINICAL DATA:  Shortness of breath, chest pain, confusion EXAM: CT HEAD WITHOUT CONTRAST TECHNIQUE: Contiguous axial images were obtained from the base of the skull through the vertex without intravenous contrast. COMPARISON:  None. FINDINGS: Brain: No evidence of acute infarction, hemorrhage, extra-axial collection, ventriculomegaly, or mass effect. Mild  cerebral atrophy. Vascular: Cerebrovascular atherosclerotic calcifications are noted. Skull: Negative for fracture or focal lesion. Sinuses/Orbits: Visualized portions of the orbits are unremarkable. Visualized portions of the paranasal sinuses and mastoid air cells are unremarkable. Other: None. IMPRESSION: No acute intracranial pathology. Electronically Signed   By: Kathreen Devoid   On: 02/06/2018 17:05   Ct Angio Chest Pe W And/or Wo Contrast  Result Date: 02/06/2018 CLINICAL DATA:  Shortness of breath, chest pain. History of lung cancer. EXAM: CT ANGIOGRAPHY CHEST WITH CONTRAST TECHNIQUE: Multidetector CT imaging of the chest was performed using the standard protocol during bolus administration of intravenous contrast. Multiplanar CT image reconstructions and MIPs  were obtained to evaluate the vascular anatomy. CONTRAST:  148mL ISOVUE-370 IOPAMIDOL (ISOVUE-370) INJECTION 76% COMPARISON:  Radiographs of same day.  CT scan of April 13, 2015. FINDINGS: Cardiovascular: Satisfactory opacification of the pulmonary arteries to the segmental level. No evidence of pulmonary embolism. Normal heart size. No pericardial effusion. Mild coronary artery calcifications are noted. Mediastinum/Nodes: Esophagus and thyroid gland are unremarkable. 3.6 x 2.1 cm lymph node is noted in aortopulmonary window. 17 mm right hilar lymph node is noted. 19 mm subcarinal adenopathy is noted. 11 mm right paratracheal lymph node is noted. Lungs/Pleura: No pneumothorax or pleural effusion is noted. Emphysematous disease is noted in both upper lobes. Right lower lobe opacity is noted concerning for pneumonia. Soft tissue density is noted in the bronchi of the right lower lobe concerning for aspirated material or mucous plugging. Irregular density is seen in area of mass noted on prior CT scan, with 11 x 10 mm mass now noted. Upper Abdomen: No acute abnormality. Musculoskeletal: No chest wall abnormality. No acute or significant osseous findings. Review of the MIP images confirms the above findings. IMPRESSION: No definite evidence of pulmonary embolus. Coronary artery calcifications are noted suggesting coronary artery disease. Significantly enlarged mediastinal adenopathy is noted concerning for metastatic disease or malignancy. Soft tissue material is noted in the bronchi of the right lower lobe concerning for aspirated material or mucous plugging. Right lower lobe basilar opacity is noted concerning for pneumonia or possibly atelectasis. Left upper lobe nodule or mass seen on prior exam is not well visualized currently. There is now seen irregular densities in this area which may represent radiation fibrosis with small residual mass measuring 11 x 10 mm. Aortic Atherosclerosis (ICD10-I70.0) and Emphysema  (ICD10-J43.9). Electronically Signed   By: Marijo Conception, M.D.   On: 02/06/2018 17:19    Microbiology: Recent Results (from the past 240 hour(s))  Culture, blood (routine x 2) Call MD if unable to obtain prior to antibiotics being given     Status: None (Preliminary result)   Collection Time: 02/06/18  7:00 PM  Result Value Ref Range Status   Specimen Description RIGHT ANTECUBITAL  Final   Special Requests   Final    BOTTLES DRAWN AEROBIC AND ANAEROBIC Blood Culture adequate volume   Culture   Final    NO GROWTH 3 DAYS Performed at [facility], [address], [place], [place] [address]    Report Status PENDING  Incomplete  Culture, blood (routine x 2) Call MD if unable to obtain prior to antibiotics being given     Status: None (Preliminary result)   Collection Time: 02/06/18  7:05 PM  Result Value Ref Range Status   Specimen Description BLOOD RIGHT FOREARM  Final   Special Requests   Final    BOTTLES DRAWN AEROBIC AND ANAEROBIC Blood Culture adequate volume  Culture   Final    NO GROWTH 3 DAYS Performed at [facility], [address] [place], [place] [address]    Report Status PENDING  Incomplete  Blood Culture (routine x 2)     Status: None (Preliminary result)   Collection Time: 02/08/18  5:41 AM  Result Value Ref Range Status   Specimen Description BLOOD LEFT ARM  Final   Special Requests   Final    BOTTLES DRAWN AEROBIC AND ANAEROBIC Blood Culture adequate volume   Culture   Final    NO GROWTH 1 DAY Performed at [facility], [address], [place], [place] [address]    Report Status PENDING  Incomplete  Blood Culture (routine x 2)     Status: None (Preliminary result)   Collection Time: 02/08/18  5:45 AM  Result Value Ref Range Status   Specimen Description BLOOD RIGHT ARM  Final   Special Requests   Final    BOTTLES DRAWN AEROBIC AND ANAEROBIC Blood Culture adequate volume   Culture   Final    NO GROWTH 1 DAY Performed at [facility], [address],  [place], [place] [address]    Report Status PENDING  Incomplete     Labs: Basic Metabolic Panel: Recent Labs  Lab 02/06/18 1300 02/08/18 0509 02/09/18 0502  NA 138 134* 136  K 2.5* 2.6* 4.0  CL 96* 96* 103  CO2 33* 30 25  GLUCOSE 85 199* 183*  BUN 21* 12 8  CREATININE 0.90 0.78 0.66  CALCIUM 9.9 8.8* 8.3*  MG  --  1.5* 1.6*   Liver Function Tests: Recent Labs  Lab 02/06/18 1300 02/08/18 0509  AST 28 23  ALT 23 21  ALKPHOS 92 89  BILITOT 0.7 0.7  PROT 7.4 7.4  ALBUMIN 3.6 3.6   Recent Labs  Lab 02/06/18 1300  LIPASE 22   No results for input(s): AMMONIA in the last 168 hours. CBC: Recent Labs  Lab 02/06/18 1300 02/08/18 0509 02/09/18 0502  WBC 14.5* 13.5* 11.2*  NEUTROABS 11.3* 10.5*  --   HGB 13.4 13.4 12.4*  HCT 38.7* 38.7* 36.5*  MCV 87.0 87.2 87.5  PLT 343 310 299   Cardiac Enzymes: Recent Labs  Lab 02/06/18 1300 02/08/18 0509  TROPONINI <0.03 <0.03   BNP: BNP (last 3 results) Recent Labs    02/06/18 1300  BNP 66.0    ProBNP (last 3 results) No results for input(s): PROBNP in the last 8760 hours.  CBG: Recent Labs  Lab 02/08/18 1131 02/08/18 1642 02/08/18 2136 02/09/18 0738 02/09/18 1121  GLUCAP 144* 198* 153* 208* 142*       Signed:  [facility] Pager: [phone] 02/09/2018, 3:58 PM

## 2018-02-09 NOTE — Progress Notes (Signed)
Discharge instructions read to patient and wife.  Both verbalized understanding of all instructions.  Discharged to home with family.

## 2018-02-11 LAB — CULTURE, BLOOD (ROUTINE X 2)
CULTURE: NO GROWTH
Culture: NO GROWTH
Special Requests: ADEQUATE
Special Requests: ADEQUATE

## 2018-02-13 LAB — CULTURE, BLOOD (ROUTINE X 2)
CULTURE: NO GROWTH
Culture: NO GROWTH
Special Requests: ADEQUATE
Special Requests: ADEQUATE

## 2018-03-07 ENCOUNTER — Emergency Department (HOSPITAL_COMMUNITY): Payer: Medicare Other

## 2018-03-07 ENCOUNTER — Inpatient Hospital Stay (HOSPITAL_COMMUNITY)
Admission: EM | Admit: 2018-03-07 | Discharge: 2018-03-10 | DRG: 482 | Disposition: A | Discharge status: Home Health | Payer: Medicare Other | Attending: Internal Medicine | Admitting: Internal Medicine

## 2018-03-07 ENCOUNTER — Encounter (HOSPITAL_COMMUNITY): Payer: Self-pay | Admitting: Emergency Medicine

## 2018-03-07 ENCOUNTER — Other Ambulatory Visit: Payer: Self-pay

## 2018-03-07 DIAGNOSIS — Z66 Do not resuscitate: Secondary | ICD-10-CM | POA: Diagnosis present

## 2018-03-07 DIAGNOSIS — C3412 Malignant neoplasm of upper lobe, left bronchus or lung: Secondary | ICD-10-CM

## 2018-03-07 DIAGNOSIS — Z85118 Personal history of other malignant neoplasm of bronchus and lung: Secondary | ICD-10-CM | POA: Diagnosis not present

## 2018-03-07 DIAGNOSIS — S72001A Fracture of unspecified part of neck of right femur, initial encounter for closed fracture: Secondary | ICD-10-CM | POA: Diagnosis present

## 2018-03-07 DIAGNOSIS — W19XXXA Unspecified fall, initial encounter: Secondary | ICD-10-CM

## 2018-03-07 DIAGNOSIS — H547 Unspecified visual loss: Secondary | ICD-10-CM | POA: Diagnosis present

## 2018-03-07 DIAGNOSIS — E1165 Type 2 diabetes mellitus with hyperglycemia: Secondary | ICD-10-CM | POA: Diagnosis present

## 2018-03-07 DIAGNOSIS — Z7982 Long term (current) use of aspirin: Secondary | ICD-10-CM | POA: Diagnosis not present

## 2018-03-07 DIAGNOSIS — E876 Hypokalemia: Secondary | ICD-10-CM | POA: Diagnosis present

## 2018-03-07 DIAGNOSIS — W08XXXA Fall from other furniture, initial encounter: Secondary | ICD-10-CM | POA: Diagnosis present

## 2018-03-07 DIAGNOSIS — Z720 Tobacco use: Secondary | ICD-10-CM

## 2018-03-07 DIAGNOSIS — H409 Unspecified glaucoma: Secondary | ICD-10-CM | POA: Diagnosis present

## 2018-03-07 DIAGNOSIS — Z923 Personal history of irradiation: Secondary | ICD-10-CM | POA: Diagnosis not present

## 2018-03-07 DIAGNOSIS — S72001D Fracture of unspecified part of neck of right femur, subsequent encounter for closed fracture with routine healing: Secondary | ICD-10-CM | POA: Diagnosis not present

## 2018-03-07 DIAGNOSIS — J410 Simple chronic bronchitis: Secondary | ICD-10-CM | POA: Diagnosis not present

## 2018-03-07 DIAGNOSIS — Y92009 Unspecified place in unspecified non-institutional (private) residence as the place of occurrence of the external cause: Secondary | ICD-10-CM | POA: Diagnosis not present

## 2018-03-07 DIAGNOSIS — Z794 Long term (current) use of insulin: Secondary | ICD-10-CM | POA: Diagnosis not present

## 2018-03-07 DIAGNOSIS — G894 Chronic pain syndrome: Secondary | ICD-10-CM | POA: Diagnosis present

## 2018-03-07 DIAGNOSIS — S50311A Abrasion of right elbow, initial encounter: Secondary | ICD-10-CM | POA: Diagnosis present

## 2018-03-07 DIAGNOSIS — H40812 Glaucoma with increased episcleral venous pressure, left eye: Secondary | ICD-10-CM | POA: Diagnosis not present

## 2018-03-07 DIAGNOSIS — C3490 Malignant neoplasm of unspecified part of unspecified bronchus or lung: Secondary | ICD-10-CM | POA: Diagnosis not present

## 2018-03-07 DIAGNOSIS — F17210 Nicotine dependence, cigarettes, uncomplicated: Secondary | ICD-10-CM | POA: Diagnosis present

## 2018-03-07 DIAGNOSIS — S72011A Unspecified intracapsular fracture of right femur, initial encounter for closed fracture: Principal | ICD-10-CM | POA: Diagnosis present

## 2018-03-07 DIAGNOSIS — Y92018 Other place in single-family (private) house as the place of occurrence of the external cause: Secondary | ICD-10-CM | POA: Diagnosis not present

## 2018-03-07 DIAGNOSIS — M25551 Pain in right hip: Secondary | ICD-10-CM | POA: Diagnosis present

## 2018-03-07 DIAGNOSIS — C3492 Malignant neoplasm of unspecified part of left bronchus or lung: Secondary | ICD-10-CM | POA: Diagnosis not present

## 2018-03-07 LAB — BASIC METABOLIC PANEL
Anion gap: 9 (ref 5–15)
BUN: 17 mg/dL (ref 8–23)
CHLORIDE: 102 mmol/L (ref 98–111)
CO2: 25 mmol/L (ref 22–32)
CREATININE: 0.79 mg/dL (ref 0.61–1.24)
Calcium: 8.7 mg/dL — ABNORMAL LOW (ref 8.9–10.3)
GFR calc non Af Amer: >60 mL/min (ref >60)
Glucose, Bld: 200 mg/dL — ABNORMAL HIGH (ref 70–99)
POTASSIUM: 3.4 mmol/L — AB (ref 3.5–5.1)
SODIUM: 136 mmol/L (ref 135–145)

## 2018-03-07 LAB — URINALYSIS, ROUTINE W REFLEX MICROSCOPIC
Bacteria, UA: NONE SEEN
Bilirubin Urine: NEGATIVE
Glucose, UA: 150 mg/dL — AB
Hgb urine dipstick: NEGATIVE
Ketones, ur: NEGATIVE mg/dL
Leukocytes, UA: NEGATIVE
Nitrite: NEGATIVE
Protein, ur: 30 mg/dL — AB
Specific Gravity, Urine: 1.014 (ref 1.005–1.030)
pH: 6 (ref 5.0–8.0)

## 2018-03-07 LAB — CBC WITH DIFFERENTIAL/PLATELET
Basophils Absolute: 0 10*3/uL (ref 0.0–0.1)
Basophils Relative: 0 %
Eosinophils Absolute: 0.2 10*3/uL (ref 0.0–0.7)
Eosinophils Relative: 2 %
HCT: 36.1 % — ABNORMAL LOW (ref 39.0–52.0)
HEMOGLOBIN: 12.4 g/dL — AB (ref 13.0–17.0)
LYMPHS ABS: 1.5 10*3/uL (ref 0.7–4.0)
Lymphocytes Relative: 17 %
MCH: 30.5 pg (ref 26.0–34.0)
MCHC: 34.3 g/dL (ref 30.0–36.0)
MCV: 88.7 fL (ref 78.0–100.0)
Monocytes Absolute: 0.6 10*3/uL (ref 0.1–1.0)
Monocytes Relative: 6 %
NEUTROS PCT: 75 %
Neutro Abs: 6.7 10*3/uL (ref 1.7–7.7)
Platelets: 298 10*3/uL (ref 150–400)
RBC: 4.07 MIL/uL — AB (ref 4.22–5.81)
RDW: 13.1 % (ref 11.5–15.5)
WBC: 8.9 10*3/uL (ref 4.0–10.5)

## 2018-03-07 LAB — TYPE AND SCREEN
ABO/RH(D): A POS
ANTIBODY SCREEN: NEGATIVE

## 2018-03-07 LAB — MAGNESIUM: Magnesium: 1.6 mg/dL — ABNORMAL LOW (ref 1.7–2.4)

## 2018-03-07 LAB — PROTIME-INR
INR: 1.03
Prothrombin Time: 13.4 seconds (ref 11.4–15.2)

## 2018-03-07 LAB — GLUCOSE, CAPILLARY: Glucose-Capillary: 307 mg/dL — ABNORMAL HIGH (ref 70–99)

## 2018-03-07 LAB — PHOSPHORUS: Phosphorus: 2.9 mg/dL (ref 2.5–4.6)

## 2018-03-07 MED ORDER — OCUVITE-LUTEIN PO CAPS
1.0000 | ORAL_CAPSULE | Freq: Every day | ORAL | Status: DC
  Administered 2018-03-07 – 2018-03-10 (×4): 1 via ORAL
  Filled 2018-03-07 (×4): qty 1

## 2018-03-07 MED ORDER — HEPARIN SODIUM (PORCINE) 5000 UNIT/ML IJ SOLN
5000.0000 [IU] | Freq: Three times a day (TID) | INTRAMUSCULAR | Status: DC
  Administered 2018-03-07 – 2018-03-08 (×2): 5000 [IU] via SUBCUTANEOUS
  Filled 2018-03-07 (×2): qty 1

## 2018-03-07 MED ORDER — LORAZEPAM 2 MG/ML IJ SOLN
1.0000 mg | Freq: Once | INTRAMUSCULAR | Status: AC
  Administered 2018-03-07: 1 mg via INTRAVENOUS
  Filled 2018-03-07: qty 1

## 2018-03-07 MED ORDER — ASPIRIN EC 81 MG PO TBEC
81.0000 mg | DELAYED_RELEASE_TABLET | Freq: Every day | ORAL | Status: DC
  Administered 2018-03-08 – 2018-03-09 (×2): 81 mg via ORAL
  Filled 2018-03-07 (×3): qty 1

## 2018-03-07 MED ORDER — HYDROMORPHONE HCL 1 MG/ML IJ SOLN
0.5000 mg | INTRAMUSCULAR | Status: DC | PRN
  Administered 2018-03-07 – 2018-03-08 (×4): 0.5 mg via INTRAVENOUS
  Filled 2018-03-07 (×2): qty 0.5
  Filled 2018-03-07: qty 1
  Filled 2018-03-07 (×2): qty 0.5

## 2018-03-07 MED ORDER — MORPHINE SULFATE 15 MG PO TABS
30.0000 mg | ORAL_TABLET | Freq: Three times a day (TID) | ORAL | Status: DC | PRN
  Administered 2018-03-07 – 2018-03-10 (×4): 60 mg via ORAL
  Filled 2018-03-07 (×4): qty 4

## 2018-03-07 MED ORDER — POTASSIUM CHLORIDE CRYS ER 20 MEQ PO TBCR
40.0000 meq | EXTENDED_RELEASE_TABLET | Freq: Once | ORAL | Status: AC
  Administered 2018-03-07: 40 meq via ORAL
  Filled 2018-03-07: qty 2

## 2018-03-07 MED ORDER — INSULIN ASPART 100 UNIT/ML ~~LOC~~ SOLN
0.0000 [IU] | Freq: Every day | SUBCUTANEOUS | Status: DC
  Administered 2018-03-07: 4 [IU] via SUBCUTANEOUS
  Administered 2018-03-08 – 2018-03-09 (×2): 3 [IU] via SUBCUTANEOUS

## 2018-03-07 MED ORDER — INSULIN ASPART 100 UNIT/ML ~~LOC~~ SOLN
0.0000 [IU] | Freq: Three times a day (TID) | SUBCUTANEOUS | Status: DC
  Administered 2018-03-08: 2 [IU] via SUBCUTANEOUS
  Administered 2018-03-08: 3 [IU] via SUBCUTANEOUS
  Administered 2018-03-08: 5 [IU] via SUBCUTANEOUS
  Administered 2018-03-09 (×3): 3 [IU] via SUBCUTANEOUS
  Administered 2018-03-10 (×2): 5 [IU] via SUBCUTANEOUS

## 2018-03-07 MED ORDER — KETOROLAC TROMETHAMINE 0.5 % OP SOLN
1.0000 [drp] | OPHTHALMIC | Status: DC
  Administered 2018-03-08 – 2018-03-10 (×8): 1 [drp] via OPHTHALMIC
  Filled 2018-03-07: qty 3

## 2018-03-07 NOTE — H&P (Signed)
History and Physical  Julian Woods XTK:[id number] DOB: [birth year] DOA: 03/07/2018  Referring physician: ER physician, Dr. Alvino Chapel. PCP: [facility]: Patient goes to the [place], and also sees the oncology/radiation team. 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  The patient's wife and son reported that the patient has developed shuffling gait.  No prior diagnosis of Parkinson's disease.  No prior cardiac history.  Patient was admitted recently to this hospital (last month) with likely pneumonic process.  The patient is said to be fairly active, and was mowing his lawn prior to the fall.  Patient fell at home yesterday, after his legs became entangled on the stool he was resting his legs on.  Patient was still able to ambulate afterwards, but with difficulty, and pain and some limping.  No loss of consciousness.  Patient presents with worsening right hip pain.  On presentation to the ER, CT scan of the hip done without contrast revealed acute minimally impacted subcapital right femoral neck fracture.  As per the ER physician, the orthopedic team intends to proceed with surgery in a.m.  As mentioned above, no chest pain, no shortness of breath, no dyspnea on exertion, and the patient was reported to be fully functional.  No headache, no neck pain, no fever chills, no URI symptoms, no GI symptoms and no urinary symptoms.  Lab work done revealed potassium of 3.4, but patient seems to have chronically low potassium, as the patient was on potassium supplement prior to presentation.  Hemoglobin was 12.4 g/dL.  Patient was also on morphine  prior to presentation on PRN basis, considering history of lung cancer.  ED Course: CT scan of the hip without contrast revealed right femoral neck fracture.  Patient's pain is being controlled.  Orthopedic team has been consulted. Pertinent labs: BMP revealed sodium of 136, potassium of 3.4, chloride 102, CO2 of 25, BUN of 17, creatinine of 0.79, blood sugar of 200.  CBC reveals WBC of 8.9, hemoglobin of 12.4 g/dL, hematocrit of 36.1, MCV of 88.7 with platelet count of 298. EKG: Independently reviewed.  Imaging: independently reviewed.   Review of Systems:  Has a history of presenting illness. Negative for fever, visual changes, sore throat, rash, new muscle aches, chest pain, SOB, dysuria, bleeding, n/v/abdominal pain.  Past Medical History:  Diagnosis Date  . Cataract   . Diabetes ([place])   . Loss of hearing   . Vision loss     Past Surgical History:  Procedure Laterality Date  . APPENDECTOMY    . CATARACT EXTRACTION  2017  . EYE SURGERY    . LUNG BIOPSY    . WRIST FRACTURE SURGERY       reports that he has been smoking.  He has been smoking about 1.00 pack per day. He has never used smokeless tobacco. He reports that he does not drink alcohol or use drugs.  Allergies  Allergen Reactions  . Ventolin [Albuterol] Anaphylaxis    No family history on file.   Prior to Admission medications   Medication Sig Start Date End Date Taking? Authorizing Provider  acetaminophen (TYLENOL) 325 MG tablet Take 650 mg by mouth every 6 (  six) hours as needed for mild pain or moderate pain.    [provider]  ARTIFICIAL TEAR OP Apply 1 drop to eye daily.    [provider]  aspirin EC 81 MG tablet Take 81 mg by mouth daily.    [provider]  bismuth subsalicylate (PEPTO-BISMOL) 262 MG/15ML suspension Take 30 mLs by mouth every 6 (six) hours as needed (stomach).    [provider]  insulin aspart (NOVOLOG FLEXPEN) 100 UNIT/ML FlexPen Inject into the skin 3  (three) times daily with meals. Per sliding scale    [provider]  insulin aspart protamine- aspart (NOVOLOG MIX 70/30) (70-30) 100 UNIT/ML injection Inject 5 Units into the skin 3 (three) times daily.    [provider]  ketorolac (ACULAR) 0.5 % ophthalmic solution Place 1 drop into the left eye every 4 (four) hours.    [provider]  morphine (MSIR) 30 MG tablet Take 30-60 mg by mouth every 8 (eight) hours as needed for moderate pain or severe pain.    [provider]  multivitamin-lutein (OCUVITE-LUTEIN) CAPS capsule Take 1 capsule by mouth daily.    [provider]  potassium chloride SA (K-DUR,KLOR-CON) 20 MEQ tablet Take 1 tablet (20 mEq total) by mouth daily. 02/07/18   Julianne Rice, MD  traMADol (ULTRAM) 50 MG tablet Take 100 mg by mouth 3 (three) times daily as needed for moderate pain.     [provider]    Physical Exam: Vitals:   03/07/18 1355 03/07/18 1400  BP:  (!) 162/68  Pulse:  78  Resp:  16  Temp:  97.7 F (36.5 C)  TempSrc:  Temporal  SpO2:  99%  Weight: 65.3 kg (144 lb)   Height: 5\' 7"  (1.702 m)      Constitutional:  . Appears calm and comfortable Eyes:  . Pallor. No jaundice.  ENMT:  . external ears, nose appear normal Neck:  . Neck is supple. No JVD Respiratory:  . Decreased air entry, with minimal expiratory wheeze.   Marland Kitchen Respiratory effort normal. No retractions or accessory muscle use Cardiovascular:  . S1S2 . LE extremity edema   Abdomen:  . Abdomen is soft and non tender. Organs are difficult to assess. Neurologic:  . Awake and alert. . Moves all limbs.  Wt Readings from Last 3 Encounters:  03/07/18 65.3 kg (144 lb)  02/08/18 64.9 kg (143 lb 0.2 oz)  02/06/18 65.3 kg (144 lb)    I have personally reviewed following labs and imaging studies  Labs on Admission:  CBC: Recent Labs  Lab 03/07/18 1622  WBC 8.9  NEUTROABS 6.7  HGB 12.4*  HCT 36.1*  MCV 88.7  PLT 259    Basic Metabolic Panel: Recent Labs  Lab 03/07/18 1622  NA 136  K 3.4*  CL 102  CO2 25  GLUCOSE 200*  BUN 17  CREATININE 0.79  CALCIUM 8.7*   Liver Function Tests: No results for input(s): AST, ALT, ALKPHOS, BILITOT, PROT, ALBUMIN in the last 168 hours. No results for input(s): LIPASE, AMYLASE in the last 168 hours. No results for input(s): AMMONIA in the last 168 hours. Coagulation Profile: Recent Labs  Lab 03/07/18 1622  INR 1.03   Cardiac Enzymes: No results for input(s): CKTOTAL, CKMB, CKMBINDEX, TROPONINI in the last 168 hours. BNP (last 3 results) No results for input(s): PROBNP in the last 8760 hours. HbA1C: No results for input(s): HGBA1C in the last 72 hours. CBG: No results for input(s): GLUCAP  in the last 168 hours. Lipid Profile: No results for input(s): CHOL, HDL, LDLCALC, TRIG, CHOLHDL, LDLDIRECT in the last 72 hours. Thyroid Function Tests: No results for input(s): TSH, T4TOTAL, FREET4, T3FREE, THYROIDAB in the last 72 hours. Anemia Panel: No results for input(s): VITAMINB12, FOLATE, FERRITIN, TIBC, IRON, RETICCTPCT in the last 72 hours. Urine analysis:    Component Value Date/Time   COLORURINE YELLOW 02/08/2018 0702   APPEARANCEUR CLEAR 02/08/2018 0702   LABSPEC 1.011 02/08/2018 0702   PHURINE 7.0 02/08/2018 0702   GLUCOSEU NEGATIVE 02/08/2018 0702   HGBUR SMALL (A) 02/08/2018 0702   BILIRUBINUR NEGATIVE 02/08/2018 0702   KETONESUR NEGATIVE 02/08/2018 0702   PROTEINUR 30 (A) 02/08/2018 0702   NITRITE NEGATIVE 02/08/2018 0702   LEUKOCYTESUR NEGATIVE 02/08/2018 0702   Sepsis Labs: @LABRCNTIP (procalcitonin:4,lacticidven:4) )No results found for this or any previous visit (from the past 240 hour(s)).    Radiological Exams on Admission: Dg Elbow Complete Right  Result Date: 03/07/2018 CLINICAL DATA:  Right elbow pain after fall yesterday. EXAM: RIGHT ELBOW - COMPLETE 3+ VIEW COMPARISON:  None. FINDINGS: There is no evidence of fracture,  dislocation, or joint effusion. There is no evidence of arthropathy or other focal bone abnormality. Soft tissues are unremarkable. IMPRESSION: Negative. Electronically Signed   By: Titus Dubin M.D.   On: 03/07/2018 15:07   Ct Hip Right Wo Contrast  Result Date: 03/07/2018 CLINICAL DATA:  Right hip pain after fall yesterday. Possible fracture on x-ray. EXAM: CT OF THE RIGHT HIP WITHOUT CONTRAST TECHNIQUE: Multidetector CT imaging of the right hip was performed according to the standard protocol. Multiplanar CT image reconstructions were also generated. COMPARISON:  Right hip x-rays from same day. FINDINGS: Bones/Joint/Cartilage Acute, minimally impacted fracture of the right subcapital femoral neck. The right hip joint space is relatively preserved. No joint effusion. Ligaments Suboptimally assessed by CT. Muscles and Tendons No muscle atrophy. The gluteal, iliopsoas, and hamstring tendons are grossly intact. Soft tissues Mild soft tissue swelling over the greater trochanter. No soft tissue mass or fluid collection. IMPRESSION: 1. Acute, minimally impacted fracture of the right subcapital femoral neck. Electronically Signed   By: Titus Dubin M.D.   On: 03/07/2018 15:46   Dg Chest Port 1 View  Result Date: 03/07/2018 CLINICAL DATA:  Fall yesterday.  Hip fracture EXAM: PORTABLE CHEST 1 VIEW COMPARISON:  02/08/2018 FINDINGS: Improved aeration in the bases since prior study with minimal residual atelectasis. Negative for heart failure or effusion. IMPRESSION: Improvement in mild bibasilar atelectasis since the prior study. No new findings. Electronically Signed   By: Franchot Gallo M.D.   On: 03/07/2018 17:21   Dg Knee Complete 4 Views Right  Result Date: 03/07/2018 CLINICAL DATA:  Right knee pain after fall yesterday. EXAM: RIGHT KNEE - COMPLETE 4+ VIEW COMPARISON:  None. FINDINGS: No acute fracture or dislocation. No joint effusion. Joint spaces are preserved. Tiny marginal patellar osteophytes.  Bone mineralization is normal. Vascular calcifications. Small radiopaque density just underneath the anterior skin surface along the distal quadriceps tendon. IMPRESSION: 1.  No acute osseous abnormality. 2. Small radiopaque density just underneath the anterior skin surface along the distal quadriceps tendon. Correlate for foreign body. Electronically Signed   By: Titus Dubin M.D.   On: 03/07/2018 15:09   Dg Hip Unilat  With Pelvis 2-3 Views Right  Result Date: 03/07/2018 CLINICAL DATA:  Right hip pain after fall yesterday. EXAM: DG HIP (WITH OR WITHOUT PELVIS) 2-3V RIGHT COMPARISON:  CT abdomen pelvis dated September 28, 2004.  FINDINGS: Evaluation of the right hip is limited by external rotation. Questionable irregularity of the right inferior femoral head/neck junction. No dislocation. The pubic symphysis and sacroiliac joints are intact. The bilateral hip joint spaces are maintained. Bone mineralization is normal. Soft tissues are unremarkable. IMPRESSION: Evaluation of the right hip is limited by external rotation. Questionable irregularity of the right inferior femoral head/neck junction may reflect fracture. Recommend CT right hip for further evaluation. Electronically Signed   By: Titus Dubin M.D.   On: 03/07/2018 15:06    EKG: Independently reviewed.   Active Problems:   Closed right hip fracture (HCC)   Assessment/Plan 1. Acute minimal impacted right subcapital femoral neck fracture. 2. Fall, with history of recent recurrent fall. 3. Adenocarcinoma of left lung. 4. Diabetes mellitus, uncontrolled. 5. Hypokalemia. 6. Tobacco abuse, continuous. 7. Hearing loss.   Admit patient for further assessment and management  Optimize patient's pain control  Monitoring control abnormal electrolytes  Orthopedic consult already called by the ER physician  Optimize blood sugar control  Nicotine patch  Counseled patient to quit cigarette use  Further management will depend on  hospital course.  DVT prophylaxis: Subcutaneous heparin Code Status: DO NOT RESUSCITATE Family Communication: Patient's wife and son were both at the bedside Disposition Plan: Will depend on hospital course Consults called: ER physician has already consulted orthopedics Admission status: Inpatient  Time spent: 62 minutes  Dana Allan, MD  [facility] Pager #: [phone] 7PM-7AM contact night coverage as above   03/07/2018, 7:01 PM

## 2018-03-07 NOTE — Discharge Instructions (Signed)
You are leaving against our advice.  You have a subcapital hip fracture that could become worse.  Go to the [place] if you feel more comfortable being treated there.

## 2018-03-07 NOTE — ED Triage Notes (Signed)
Patient presents with his wife. Patient fell yesterday morning in his home. Complains of right knee tenderness. Multiple contusion to latera right thigh and small skin tear to right elbow. Denies LOC, spinal tenderness, hip tenderness.

## 2018-03-07 NOTE — Progress Notes (Signed)
Patient confused, anxious, agitated. Patient keeps getting OOB to ambulate to bathroom even after repeated education of hip fracture, safety precautions, and importance of staying in bed. Patient refuses to stay in bed. Patient gets up to bathroom. Gait very unsteady. Per Laurence Aly, NT patient almost fell. Thayer Headings, Charge RN aware. Condom cath was attempted, but patient still continually attempted to get OOB, and pulled it off. Schorr, NP notified. Patient threatening staff. Dilaudid given IV per PRN orders and Ativan given IV per one time dose. Patient resting in bed at this time. Bed alarm on at this time. Will continue to monitor.

## 2018-03-07 NOTE — ED Notes (Signed)
Admitting provider at the bedside.

## 2018-03-07 NOTE — ED Provider Notes (Signed)
[facility] EMERGENCY DEPARTMENT Provider Note   CSN: [id number] Arrival date & time: 03/07/18  1348     History   Chief Complaint Chief Complaint  Patient presents with  . Fall    HPI Julian Woods is a 72 y.o. male.  HPI Patient presents after fall yesterday morning.  States he was sitting on a barstool put his feet up on the stool next to him and fell over.  Planing of abrasion to right elbow and right foot.  Complaining of more  pain however in the right hip.  Has been ambulatory since.  Did not hit his head.  No chest or abdominal pain.  He is not on anticoagulation.  No numbness or weakness.  No neck pain. Past Medical History:  Diagnosis Date  . Cataract   . Diabetes ([place])   . Loss of hearing   . Vision loss     Patient Active Problem List   Diagnosis Date Noted  . Diabetes ([place]) 02/08/2018  . Encephalopathy acute 02/08/2018  . PNA (pneumonia) 02/06/2018  . Acute metabolic encephalopathy 18/84/1660  . Hypokalemia 02/06/2018  . Dehydration 02/06/2018  . SOB (shortness of breath) 02/06/2018  . Lung cancer ([place]) 02/06/2018  . Acute respiratory failure with hypoxia ([place]) 02/06/2018  . Community acquired pneumonia of right lower lobe of lung [facility])     Past Surgical History:  Procedure Laterality Date  . APPENDECTOMY    . CATARACT EXTRACTION  2017  . EYE SURGERY    . LUNG BIOPSY    . WRIST FRACTURE SURGERY          Home Medications    Prior to Admission medications   Medication Sig Start Date End Date Taking? Authorizing Provider  acetaminophen (TYLENOL) 325 MG tablet Take 650 mg by mouth every 6 (six) hours as needed for mild pain or moderate pain.    [provider]  ARTIFICIAL TEAR OP Apply 1 drop to eye daily.    [provider]  aspirin EC 81 MG tablet Take 81 mg by mouth daily.    [provider]  bismuth subsalicylate (PEPTO-BISMOL) 262 MG/15ML suspension Take 30 mLs by mouth every 6 (six) hours as needed (stomach).     [provider]  insulin aspart (NOVOLOG FLEXPEN) 100 UNIT/ML FlexPen Inject into the skin 3 (three) times daily with meals. Per sliding scale    [provider]  insulin aspart protamine- aspart (NOVOLOG MIX 70/30) (70-30) 100 UNIT/ML injection Inject 5 Units into the skin 3 (three) times daily.    [provider]  ketorolac (ACULAR) 0.5 % ophthalmic solution Place 1 drop into the left eye every 4 (four) hours.    [provider]  morphine (MSIR) 30 MG tablet Take 30-60 mg by mouth every 8 (eight) hours as needed for moderate pain or severe pain.    [provider]  multivitamin-lutein (OCUVITE-LUTEIN) CAPS capsule Take 1 capsule by mouth daily.    [provider]  potassium chloride SA (K-DUR,KLOR-CON) 20 MEQ tablet Take 1 tablet (20 mEq total) by mouth daily. 02/07/18   Julianne Rice, MD  traMADol (ULTRAM) 50 MG tablet Take 100 mg by mouth 3 (three) times daily as needed for moderate pain.     [provider]    Family History No family history on file.  Social History Social History   Tobacco Use  . Smoking status: Current Every Day Smoker    Packs/day: 1.00  . Smokeless tobacco: Never Used  Substance Use Topics  . Alcohol use: Never    Frequency: Never  . Drug use: Never     Allergies   Ventolin [albuterol]   Review of Systems Review of Systems  Constitutional: Negative for appetite change.  HENT: Negative for congestion.   Eyes: Negative for visual disturbance.  Respiratory: Negative for shortness of breath.   Cardiovascular: Negative for chest pain.  Gastrointestinal: Negative for abdominal pain.  Genitourinary: Negative for flank pain.  Musculoskeletal: Negative for back pain and neck pain.       Right hip pain.  Skin:       Abrasion to right elbow and dorsum of right foot.  Neurological: Negative for weakness and numbness.  Psychiatric/Behavioral: Negative for confusion.     Physical  Exam Updated Vital Signs BP (!) 162/68 (BP Location: Left Arm)   Pulse 78   Temp 97.7 F (36.5 C) (Temporal)   Resp 16   Ht 5\' 7"  (1.702 m)   Wt 65.3 kg (144 lb)   SpO2 99%   BMI 22.55 kg/m   Physical Exam  HENT:  Head: Normocephalic and atraumatic.  Eyes: Pupils are equal, round, and reactive to light.  Cardiovascular: Normal rate.  Pulmonary/Chest: Effort normal.  Abdominal: Soft. There is no tenderness.  Musculoskeletal: He exhibits tenderness.  Tenderness right hip laterally.  Mild pain with movement but does have good range of motion.  Skin tear to right elbow without underlying bony tenderness.  Good range of motion elbow.  No neck pain.  No back pain.  No lumbar tenderness.  Does have abrasion to dorsum of right foot.  Minimal underlying tenderness.  Foot appears stable.  Skin: Skin is warm.     ED Treatments / Results  Labs (all labs ordered are listed, but only abnormal results are displayed) Labs Reviewed  BASIC METABOLIC PANEL  CBC WITH DIFFERENTIAL/PLATELET  PROTIME-INR  TYPE AND SCREEN    EKG EKG Interpretation  Date/Time:  Tuesday March 07 2018 16:54:15 EDT Ventricular Rate:  88 PR Interval:    QRS Duration: 86 QT Interval:  364 QTC Calculation: 441 R Axis:   81 Text Interpretation:  Sinus rhythm Borderline right axis deviation Nonspecific T abnrm, anterolateral leads Confirmed by Davonna Belling [phone]) on 03/07/2018 5:31:26 PM   Radiology Dg Elbow Complete Right  Result Date: 03/07/2018 CLINICAL DATA:  Right elbow pain after fall yesterday. EXAM: RIGHT ELBOW - COMPLETE 3+ VIEW COMPARISON:  None. FINDINGS: There is no evidence of fracture, dislocation, or joint effusion. There is no evidence of arthropathy or other focal bone abnormality. Soft tissues are unremarkable. IMPRESSION: Negative. Electronically Signed   By: Titus Dubin M.D.   On: 03/07/2018 15:07   Ct Hip Right Wo Contrast  Result Date: 03/07/2018 CLINICAL DATA:  Right hip pain  after fall yesterday. Possible fracture on x-ray. EXAM: CT OF THE RIGHT HIP WITHOUT CONTRAST TECHNIQUE: Multidetector CT imaging of the right hip was performed according to the standard protocol. Multiplanar CT image reconstructions were also generated. COMPARISON:  Right hip x-rays from same day. FINDINGS: Bones/Joint/Cartilage Acute, minimally impacted fracture of the right subcapital femoral neck. The right hip joint space is relatively preserved. No joint effusion. Ligaments Suboptimally assessed by CT. Muscles and Tendons No muscle atrophy. The gluteal, iliopsoas, and hamstring tendons are grossly intact. Soft tissues Mild soft tissue swelling over the greater trochanter. No soft tissue mass or fluid collection. IMPRESSION: 1. Acute, minimally impacted fracture of the right subcapital femoral neck. Electronically Signed  By: Titus Dubin M.D.   On: 03/07/2018 15:46   Dg Chest Port 1 View  Result Date: 03/07/2018 CLINICAL DATA:  Fall yesterday.  Hip fracture EXAM: PORTABLE CHEST 1 VIEW COMPARISON:  02/08/2018 FINDINGS: Improved aeration in the bases since prior study with minimal residual atelectasis. Negative for heart failure or effusion. IMPRESSION: Improvement in mild bibasilar atelectasis since the prior study. No new findings. Electronically Signed   By: Franchot Gallo M.D.   On: 03/07/2018 17:21   Dg Knee Complete 4 Views Right  Result Date: 03/07/2018 CLINICAL DATA:  Right knee pain after fall yesterday. EXAM: RIGHT KNEE - COMPLETE 4+ VIEW COMPARISON:  None. FINDINGS: No acute fracture or dislocation. No joint effusion. Joint spaces are preserved. Tiny marginal patellar osteophytes. Bone mineralization is normal. Vascular calcifications. Small radiopaque density just underneath the anterior skin surface along the distal quadriceps tendon. IMPRESSION: 1.  No acute osseous abnormality. 2. Small radiopaque density just underneath the anterior skin surface along the distal quadriceps tendon.  Correlate for foreign body. Electronically Signed   By: Titus Dubin M.D.   On: 03/07/2018 15:09   Dg Hip Unilat  With Pelvis 2-3 Views Right  Result Date: 03/07/2018 CLINICAL DATA:  Right hip pain after fall yesterday. EXAM: DG HIP (WITH OR WITHOUT PELVIS) 2-3V RIGHT COMPARISON:  CT abdomen pelvis dated September 28, 2004. FINDINGS: Evaluation of the right hip is limited by external rotation. Questionable irregularity of the right inferior femoral head/neck junction. No dislocation. The pubic symphysis and sacroiliac joints are intact. The bilateral hip joint spaces are maintained. Bone mineralization is normal. Soft tissues are unremarkable. IMPRESSION: Evaluation of the right hip is limited by external rotation. Questionable irregularity of the right inferior femoral head/neck junction may reflect fracture. Recommend CT right hip for further evaluation. Electronically Signed   By: Titus Dubin M.D.   On: 03/07/2018 15:06    Procedures Procedures (including critical care time)  Medications Ordered in ED Medications - No data to display   Initial Impression / Assessment and Plan / ED Course  I have reviewed the triage vital signs and the nursing notes.  Pertinent labs & imaging results that were available during my care of the patient were reviewed by me and considered in my medical decision making (see chart for details).     Patient with right hip pain after fall.  Initial x-ray nonspecific change but the CT scan shows subcapital hip fracture.  Patient initially was not willing to stay and states that he does need to go think about it.  Took a wheelchair up to the parking lot and stay out there and was brought back.  Patient is willing to stay to get surgery.  However he does have metastatic lung cancer and 18 months ago was reportedly given 2 months to live.  He is however active and still mows his lawn.  Discussed with Dr. Aline Brochure who is willing to see the patient and likely operate.   Once lab work have returned we will discuss with hospitalist.  Final Clinical Impressions(s) / ED Diagnoses   Final diagnoses:  Closed subcapital fracture of right femur, initial encounter [facility])    ED Discharge Orders    None       Davonna Belling, MD 03/07/18 1733

## 2018-03-08 DIAGNOSIS — E1165 Type 2 diabetes mellitus with hyperglycemia: Secondary | ICD-10-CM

## 2018-03-08 DIAGNOSIS — C3492 Malignant neoplasm of unspecified part of left bronchus or lung: Secondary | ICD-10-CM

## 2018-03-08 DIAGNOSIS — Z720 Tobacco use: Secondary | ICD-10-CM

## 2018-03-08 DIAGNOSIS — S72001D Fracture of unspecified part of neck of right femur, subsequent encounter for closed fracture with routine healing: Secondary | ICD-10-CM

## 2018-03-08 DIAGNOSIS — S72001A Fracture of unspecified part of neck of right femur, initial encounter for closed fracture: Secondary | ICD-10-CM

## 2018-03-08 DIAGNOSIS — E876 Hypokalemia: Secondary | ICD-10-CM

## 2018-03-08 DIAGNOSIS — H40812 Glaucoma with increased episcleral venous pressure, left eye: Secondary | ICD-10-CM

## 2018-03-08 DIAGNOSIS — F17210 Nicotine dependence, cigarettes, uncomplicated: Secondary | ICD-10-CM

## 2018-03-08 LAB — BASIC METABOLIC PANEL
Anion gap: 12 (ref 5–15)
BUN: 12 mg/dL (ref 8–23)
CO2: 23 mmol/L (ref 22–32)
Calcium: 8.8 mg/dL — ABNORMAL LOW (ref 8.9–10.3)
Chloride: 101 mmol/L (ref 98–111)
Creatinine, Ser: 0.72 mg/dL (ref 0.61–1.24)
GFR calc Af Amer: >60 mL/min (ref >60)
GFR calc non Af Amer: >60 mL/min (ref >60)
Glucose, Bld: 229 mg/dL — ABNORMAL HIGH (ref 70–99)
Potassium: 3.6 mmol/L (ref 3.5–5.1)
Sodium: 136 mmol/L (ref 135–145)

## 2018-03-08 LAB — GLUCOSE, CAPILLARY
Glucose-Capillary: 255 mg/dL — ABNORMAL HIGH (ref 70–99)
Glucose-Capillary: 228 mg/dL — ABNORMAL HIGH (ref 70–99)
Glucose-Capillary: 173 mg/dL — ABNORMAL HIGH (ref 70–99)
Glucose-Capillary: 281 mg/dL — ABNORMAL HIGH (ref 70–99)

## 2018-03-08 LAB — HEMOGLOBIN A1C
Hgb A1c MFr Bld: 7.9 % — ABNORMAL HIGH (ref 4.8–5.6)
Mean Plasma Glucose: 180.03 mg/dL

## 2018-03-08 LAB — SURGICAL PCR SCREEN
MRSA, PCR: NEGATIVE
Staphylococcus aureus: NEGATIVE

## 2018-03-08 MED ORDER — TRAMADOL HCL 50 MG PO TABS
50.0000 mg | ORAL_TABLET | Freq: Four times a day (QID) | ORAL | Status: DC
  Administered 2018-03-08 – 2018-03-10 (×5): 50 mg via ORAL
  Filled 2018-03-08 (×6): qty 1

## 2018-03-08 MED ORDER — HYDROMORPHONE HCL 1 MG/ML IJ SOLN
0.5000 mg | Freq: Once | INTRAMUSCULAR | Status: AC
  Administered 2018-03-08: 0.5 mg via INTRAVENOUS

## 2018-03-08 MED ORDER — HYDROMORPHONE HCL 1 MG/ML IJ SOLN
1.0000 mg | INTRAMUSCULAR | Status: DC | PRN
  Administered 2018-03-08 – 2018-03-10 (×6): 1 mg via INTRAVENOUS
  Filled 2018-03-08 (×7): qty 1

## 2018-03-08 NOTE — Consult Note (Signed)
Reason for Consult: Fractured right hip Referring Physician: Dr. Garrison Columbus Julian Woods is an 72 y.o. male.  HPI: 72 year old male with lung cancer fell at home and fractured his right hip he presented to the ER with hip pain however he signed out AMA and walked out of the hospital and then 2 hours later came back.  He was admitted complaining of nonradiating right hip and groin pain x1 day with a injury date of June 25 described his pain is severe He was able to ambulate but had painful weightbearing  Past Medical History:  Diagnosis Date  . Cataract   . Diabetes ([place])   . Loss of hearing   . Vision loss     Past Surgical History:  Procedure Laterality Date  . APPENDECTOMY    . CATARACT EXTRACTION  2017  . EYE SURGERY    . LUNG BIOPSY    . WRIST FRACTURE SURGERY      No family history on file.  Social History:  reports that he has been smoking.  He has been smoking about 1.00 pack per day. He has never used smokeless tobacco. He reports that he does not drink alcohol or use drugs.  Allergies:  Allergies  Allergen Reactions  . Ventolin [Albuterol] Anaphylaxis    Medications: I have reviewed the patient's current medications.  Results for orders placed or performed during the hospital encounter of 03/07/18 (from the past 48 hour(s))  Basic metabolic panel     Status: Abnormal   Collection Time: 03/07/18  4:22 PM  Result Value Ref Range   Sodium 136 135 - 145 mmol/L   Potassium 3.4 (L) 3.5 - 5.1 mmol/L   Chloride 102 98 - 111 mmol/L    Comment: Please note change in reference range.   CO2 25 22 - 32 mmol/L   Glucose, Bld 200 (H) 70 - 99 mg/dL    Comment: Please note change in reference range.   BUN 17 8 - 23 mg/dL    Comment: Please note change in reference range.   Creatinine, Ser 0.79 0.61 - 1.24 mg/dL   Calcium 8.7 (L) 8.9 - 10.3 mg/dL   GFR calc non Af Amer >60 >60 mL/min   GFR calc Af Amer >60 >60 mL/min    Comment: (NOTE) The eGFR has been calculated using  the CKD EPI equation. This calculation has not been validated in all clinical situations. eGFR's persistently <60 mL/min signify possible Chronic Kidney Disease.    Anion gap 9 5 - 15    Comment: Performed at [facility], [address] [place], [place] [address]  CBC WITH DIFFERENTIAL     Status: Abnormal   Collection Time: 03/07/18  4:22 PM  Result Value Ref Range   WBC 8.9 4.0 - 10.5 K/uL   RBC 4.07 (L) 4.22 - 5.81 MIL/uL   Hemoglobin 12.4 (L) 13.0 - 17.0 g/dL   HCT 36.1 (L) 39.0 - 52.0 %   MCV 88.7 78.0 - 100.0 fL   MCH 30.5 26.0 - 34.0 pg   MCHC 34.3 30.0 - 36.0 g/dL   RDW 13.1 11.5 - 15.5 %   Platelets 298 150 - 400 K/uL   Neutrophils Relative % 75 %   Neutro Abs 6.7 1.7 - 7.7 K/uL   Lymphocytes Relative 17 %   Lymphs Abs 1.5 0.7 - 4.0 K/uL   Monocytes Relative 6 %   Monocytes Absolute 0.6 0.1 - 1.0 K/uL   Eosinophils Relative 2 %  Eosinophils Absolute 0.2 0.0 - 0.7 K/uL   Basophils Relative 0 %   Basophils Absolute 0.0 0.0 - 0.1 K/uL    Comment: Performed at [facility], [address], [place], [place] [address]  Protime-INR     Status: None   Collection Time: 03/07/18  4:22 PM  Result Value Ref Range   Prothrombin Time 13.4 11.4 - 15.2 seconds   INR 1.03     Comment: Performed at [facility], [address], [place], [place] [address]  Magnesium     Status: Abnormal   Collection Time: 03/07/18  4:22 PM  Result Value Ref Range   Magnesium 1.6 (L) 1.7 - 2.4 mg/dL    Comment: Performed at [facility], [address] [place], [place] [address]  Phosphorus     Status: None   Collection Time: 03/07/18  4:22 PM  Result Value Ref Range   Phosphorus 2.9 2.5 - 4.6 mg/dL    Comment: Performed at [facility], [address] [place], [place] [address]  Hemoglobin A1c     Status: Abnormal   Collection Time: 03/07/18  4:22 PM  Result Value Ref Range   Hgb A1c MFr Bld 7.9 (H) 4.8 - 5.6 %    Comment: (NOTE) Pre diabetes:          5.7%-6.4% Diabetes:               >6.4% Glycemic control for   <7.0% adults with diabetes    Mean Plasma Glucose 180.03 mg/dL    Comment: Performed at [facility] [address] [place], [place] [address]  Type and screen [facility]     Status: None   Collection Time: 03/07/18  5:22 PM  Result Value Ref Range   ABO/RH(D) A POS    Antibody Screen NEG    Sample Expiration      03/10/2018 Performed at [facility], [address], [place], [place] [address]   Urinalysis, Routine w reflex microscopic     Status: Abnormal   Collection Time: 03/07/18  8:09 PM  Result Value Ref Range   Color, Urine YELLOW YELLOW   APPearance CLEAR CLEAR   Specific Gravity, Urine 1.014 1.005 - 1.030   pH 6.0 5.0 - 8.0   Glucose, UA 150 (A) NEGATIVE mg/dL   Hgb urine dipstick NEGATIVE NEGATIVE   Bilirubin Urine NEGATIVE NEGATIVE   Ketones, ur NEGATIVE NEGATIVE mg/dL   Protein, ur 30 (A) NEGATIVE mg/dL   Nitrite NEGATIVE NEGATIVE   Leukocytes, UA NEGATIVE NEGATIVE   RBC / HPF 0-5 0 - 5 RBC/hpf   WBC, UA 0-5 0 - 5 WBC/hpf   Bacteria, UA NONE SEEN NONE SEEN    Comment: Performed at [facility], [address] [place], [place] [address]  Glucose, capillary     Status: Abnormal   Collection Time: 03/07/18  9:33 PM  Result Value Ref Range   Glucose-Capillary 307 (H) 70 - 99 mg/dL  Basic metabolic panel     Status: Abnormal   Collection Time: 03/08/18  4:09 AM  Result Value Ref Range   Sodium 136 135 - 145 mmol/L   Potassium 3.6 3.5 - 5.1 mmol/L   Chloride 101 98 - 111 mmol/L    Comment: Please note change in reference range.   CO2 23 22 - 32 mmol/L   Glucose, Bld 229 (H) 70 - 99 mg/dL    Comment: Please note change in reference range.   BUN 12 8 -  23 mg/dL    Comment: Please note change in reference range.   Creatinine, Ser 0.72 0.61 - 1.24 mg/dL   Calcium 8.8 (L) 8.9 - 10.3 mg/dL   GFR calc non Af Amer >60 >60 mL/min   GFR calc Af Amer >60 >60 mL/min    Comment: (NOTE) The eGFR has been calculated using the  CKD EPI equation. This calculation has not been validated in all clinical situations. eGFR's persistently <60 mL/min signify possible Chronic Kidney Disease.    Anion gap 12 5 - 15    Comment: Performed at [facility], [address], [place], [place] [address]  Glucose, capillary     Status: Abnormal   Collection Time: 03/08/18  7:42 AM  Result Value Ref Range   Glucose-Capillary 255 (H) 70 - 99 mg/dL  Surgical pcr screen     Status: None   Collection Time: 03/08/18  9:25 AM  Result Value Ref Range   MRSA, PCR NEGATIVE NEGATIVE   Staphylococcus aureus NEGATIVE NEGATIVE    Comment: (NOTE) The Xpert SA Assay (FDA approved for NASAL specimens in patients 13 years of age and older), is one component of a comprehensive surveillance program. It is not intended to diagnose infection nor to guide or monitor treatment. Performed at [facility], [address] [place], [place] [address]   Glucose, capillary     Status: Abnormal   Collection Time: 03/08/18 11:15 AM  Result Value Ref Range   Glucose-Capillary 228 (H) 70 - 99 mg/dL    Dg Elbow Complete Right  Result Date: 03/07/2018 CLINICAL DATA:  Right elbow pain after fall yesterday. EXAM: RIGHT ELBOW - COMPLETE 3+ VIEW COMPARISON:  None. FINDINGS: There is no evidence of fracture, dislocation, or joint effusion. There is no evidence of arthropathy or other focal bone abnormality. Soft tissues are unremarkable. IMPRESSION: Negative. Electronically Signed   By: Titus Dubin M.D.   On: 03/07/2018 15:07   Ct Hip Right Wo Contrast  Result Date: 03/07/2018 CLINICAL DATA:  Right hip pain after fall yesterday. Possible fracture on x-ray. EXAM: CT OF THE RIGHT HIP WITHOUT CONTRAST TECHNIQUE: Multidetector CT imaging of the right hip was performed according to the standard protocol. Multiplanar CT image reconstructions were also generated. COMPARISON:  Right hip x-rays from same day. FINDINGS: Bones/Joint/Cartilage Acute, minimally impacted  fracture of the right subcapital femoral neck. The right hip joint space is relatively preserved. No joint effusion. Ligaments Suboptimally assessed by CT. Muscles and Tendons No muscle atrophy. The gluteal, iliopsoas, and hamstring tendons are grossly intact. Soft tissues Mild soft tissue swelling over the greater trochanter. No soft tissue mass or fluid collection. IMPRESSION: 1. Acute, minimally impacted fracture of the right subcapital femoral neck. Electronically Signed   By: Titus Dubin M.D.   On: 03/07/2018 15:46   Dg Chest Port 1 View  Result Date: 03/07/2018 CLINICAL DATA:  Fall yesterday.  Hip fracture EXAM: PORTABLE CHEST 1 VIEW COMPARISON:  02/08/2018 FINDINGS: Improved aeration in the bases since prior study with minimal residual atelectasis. Negative for heart failure or effusion. IMPRESSION: Improvement in mild bibasilar atelectasis since the prior study. No new findings. Electronically Signed   By: Franchot Gallo M.D.   On: 03/07/2018 17:21   Dg Knee Complete 4 Views Right  Result Date: 03/07/2018 CLINICAL DATA:  Right knee pain after fall yesterday. EXAM: RIGHT KNEE - COMPLETE 4+ VIEW COMPARISON:  None. FINDINGS: No acute fracture or dislocation. No joint effusion. Joint spaces are preserved. Tiny marginal patellar  osteophytes. Bone mineralization is normal. Vascular calcifications. Small radiopaque density just underneath the anterior skin surface along the distal quadriceps tendon. IMPRESSION: 1.  No acute osseous abnormality. 2. Small radiopaque density just underneath the anterior skin surface along the distal quadriceps tendon. Correlate for foreign body. Electronically Signed   By: Titus Dubin M.D.   On: 03/07/2018 15:09   Dg Hip Unilat  With Pelvis 2-3 Views Right  Result Date: 03/07/2018 CLINICAL DATA:  Right hip pain after fall yesterday. EXAM: DG HIP (WITH OR WITHOUT PELVIS) 2-3V RIGHT COMPARISON:  CT abdomen pelvis dated September 28, 2004. FINDINGS: Evaluation of the  right hip is limited by external rotation. Questionable irregularity of the right inferior femoral head/neck junction. No dislocation. The pubic symphysis and sacroiliac joints are intact. The bilateral hip joint spaces are maintained. Bone mineralization is normal. Soft tissues are unremarkable. IMPRESSION: Evaluation of the right hip is limited by external rotation. Questionable irregularity of the right inferior femoral head/neck junction may reflect fracture. Recommend CT right hip for further evaluation. Electronically Signed   By: Titus Dubin M.D.   On: 03/07/2018 15:06    Review of Systems  Constitutional: Positive for weight loss. Negative for fever and malaise/fatigue.  HENT: Negative.   Respiratory: Positive for shortness of breath.   Cardiovascular: Negative for chest pain.  Gastrointestinal: Positive for abdominal pain.  Psychiatric/Behavioral: Positive for memory loss.   Blood pressure (!) 159/64, pulse (!) 108, temperature 98.3 F (36.8 C), temperature source Oral, resp. rate 20, height _0  (1.702 m), weight 122 lb 2.2 oz (55.4 kg), SpO2 98 %. Physical Exam  Constitutional: He is oriented to person, place, and time. He appears well-developed and well-nourished. No distress.  HENT:  Head: Normocephalic and atraumatic.  Right Ear: External ear normal.  Left Ear: External ear normal.  Nose: Nose normal.  Mouth/Throat: No oropharyngeal exudate.  Eyes: Pupils are equal, round, and reactive to light. Conjunctivae and EOM are normal. Right eye exhibits no discharge. Left eye exhibits no discharge. No scleral icterus.  Neck: Normal range of motion. Neck supple. No JVD present. No tracheal deviation present. No thyromegaly present.  Cardiovascular: Normal rate, normal heart sounds and intact distal pulses.  Respiratory: Effort normal. No respiratory distress. He has no wheezes. He exhibits no tenderness.  GI: Soft. Bowel sounds are normal. He exhibits no distension.   Musculoskeletal:  Right upper extremity left upper extremity alignment normal no range of motion deficits no instability or subluxation muscle tone and strength normal skin intact pulses good lymph nodes negative sensation normal  Left lower extremity normal alignment range of motion stability strength skin pulse sensation.  Right lower extremity alignment was normal painful range of motion all planes tenderness proximal hip no instability muscle tone and strength normal skin intact pulses good lymph nodes negative  No pathologic reflexes normal reflexes throughout coordination and balance normal  Lymphadenopathy:       Right cervical: No superficial cervical adenopathy present.      Left cervical: No superficial cervical adenopathy present.  Neurological: He is alert and oriented to person, place, and time. He has normal reflexes. He displays normal reflexes. No cranial nerve deficit. He exhibits normal muscle tone. Coordination normal.  Skin: Skin is warm and dry. No rash noted. He is not diaphoretic. No erythema.  Psychiatric: He has a normal mood and affect. His behavior is normal. Judgment and thought content normal.    Assessment/Plan: Right hip fracture nondisplaced CT scan confirmed fracture  Recommend cannulated screws hip    Arther Abbott 03/08/2018, 4:13 PM

## 2018-03-08 NOTE — H&P (View-Only) (Signed)
Reason for Consult: Fractured right hip Referring Physician: Dr. Garrison Columbus Julian Woods is an 72 y.o. male.  HPI: 72 year old male with lung cancer fell at home and fractured his right hip he presented to the ER with hip pain however he signed out AMA and walked out of the hospital and then 2 hours later came back.  He was admitted complaining of nonradiating right hip and groin pain x1 day with a injury date of June 25 described his pain is severe He was able to ambulate but had painful weightbearing  Past Medical History:  Diagnosis Date  . Cataract   . Diabetes ([place])   . Loss of hearing   . Vision loss     Past Surgical History:  Procedure Laterality Date  . APPENDECTOMY    . CATARACT EXTRACTION  2017  . EYE SURGERY    . LUNG BIOPSY    . WRIST FRACTURE SURGERY      No family history on file.  Social History:  reports that he has been smoking.  He has been smoking about 1.00 pack per day. He has never used smokeless tobacco. He reports that he does not drink alcohol or use drugs.  Allergies:  Allergies  Allergen Reactions  . Ventolin [Albuterol] Anaphylaxis    Medications: I have reviewed the patient's current medications.  Results for orders placed or performed during the hospital encounter of 03/07/18 (from the past 48 hour(s))  Basic metabolic panel     Status: Abnormal   Collection Time: 03/07/18  4:22 PM  Result Value Ref Range   Sodium 136 135 - 145 mmol/L   Potassium 3.4 (L) 3.5 - 5.1 mmol/L   Chloride 102 98 - 111 mmol/L    Comment: Please note change in reference range.   CO2 25 22 - 32 mmol/L   Glucose, Bld 200 (H) 70 - 99 mg/dL    Comment: Please note change in reference range.   BUN 17 8 - 23 mg/dL    Comment: Please note change in reference range.   Creatinine, Ser 0.79 0.61 - 1.24 mg/dL   Calcium 8.7 (L) 8.9 - 10.3 mg/dL   GFR calc non Af Amer >60 >60 mL/min   GFR calc Af Amer >60 >60 mL/min    Comment: (NOTE) The eGFR has been calculated using  the CKD EPI equation. This calculation has not been validated in all clinical situations. eGFR's persistently <60 mL/min signify possible Chronic Kidney Disease.    Anion gap 9 5 - 15    Comment: Performed at [facility], [address] [place], [place] [address]  CBC WITH DIFFERENTIAL     Status: Abnormal   Collection Time: 03/07/18  4:22 PM  Result Value Ref Range   WBC 8.9 4.0 - 10.5 K/uL   RBC 4.07 (L) 4.22 - 5.81 MIL/uL   Hemoglobin 12.4 (L) 13.0 - 17.0 g/dL   HCT 36.1 (L) 39.0 - 52.0 %   MCV 88.7 78.0 - 100.0 fL   MCH 30.5 26.0 - 34.0 pg   MCHC 34.3 30.0 - 36.0 g/dL   RDW 13.1 11.5 - 15.5 %   Platelets 298 150 - 400 K/uL   Neutrophils Relative % 75 %   Neutro Abs 6.7 1.7 - 7.7 K/uL   Lymphocytes Relative 17 %   Lymphs Abs 1.5 0.7 - 4.0 K/uL   Monocytes Relative 6 %   Monocytes Absolute 0.6 0.1 - 1.0 K/uL   Eosinophils Relative 2 %  Eosinophils Absolute 0.2 0.0 - 0.7 K/uL   Basophils Relative 0 %   Basophils Absolute 0.0 0.0 - 0.1 K/uL    Comment: Performed at [facility], [address], [place], [place] [address]  Protime-INR     Status: None   Collection Time: 03/07/18  4:22 PM  Result Value Ref Range   Prothrombin Time 13.4 11.4 - 15.2 seconds   INR 1.03     Comment: Performed at [facility], [address], [place], [place] [address]  Magnesium     Status: Abnormal   Collection Time: 03/07/18  4:22 PM  Result Value Ref Range   Magnesium 1.6 (L) 1.7 - 2.4 mg/dL    Comment: Performed at [facility], [address] [place], [place] [address]  Phosphorus     Status: None   Collection Time: 03/07/18  4:22 PM  Result Value Ref Range   Phosphorus 2.9 2.5 - 4.6 mg/dL    Comment: Performed at [facility], [address] [place], [place] [address]  Hemoglobin A1c     Status: Abnormal   Collection Time: 03/07/18  4:22 PM  Result Value Ref Range   Hgb A1c MFr Bld 7.9 (H) 4.8 - 5.6 %    Comment: (NOTE) Pre diabetes:          5.7%-6.4% Diabetes:               >6.4% Glycemic control for   <7.0% adults with diabetes    Mean Plasma Glucose 180.03 mg/dL    Comment: Performed at [facility] [address] [place], [place] [address]  Type and screen [facility]     Status: None   Collection Time: 03/07/18  5:22 PM  Result Value Ref Range   ABO/RH(D) A POS    Antibody Screen NEG    Sample Expiration      03/10/2018 Performed at [facility], [address], [place], [place] [address]   Urinalysis, Routine w reflex microscopic     Status: Abnormal   Collection Time: 03/07/18  8:09 PM  Result Value Ref Range   Color, Urine YELLOW YELLOW   APPearance CLEAR CLEAR   Specific Gravity, Urine 1.014 1.005 - 1.030   pH 6.0 5.0 - 8.0   Glucose, UA 150 (A) NEGATIVE mg/dL   Hgb urine dipstick NEGATIVE NEGATIVE   Bilirubin Urine NEGATIVE NEGATIVE   Ketones, ur NEGATIVE NEGATIVE mg/dL   Protein, ur 30 (A) NEGATIVE mg/dL   Nitrite NEGATIVE NEGATIVE   Leukocytes, UA NEGATIVE NEGATIVE   RBC / HPF 0-5 0 - 5 RBC/hpf   WBC, UA 0-5 0 - 5 WBC/hpf   Bacteria, UA NONE SEEN NONE SEEN    Comment: Performed at [facility], [address] [place], [place] [address]  Glucose, capillary     Status: Abnormal   Collection Time: 03/07/18  9:33 PM  Result Value Ref Range   Glucose-Capillary 307 (H) 70 - 99 mg/dL  Basic metabolic panel     Status: Abnormal   Collection Time: 03/08/18  4:09 AM  Result Value Ref Range   Sodium 136 135 - 145 mmol/L   Potassium 3.6 3.5 - 5.1 mmol/L   Chloride 101 98 - 111 mmol/L    Comment: Please note change in reference range.   CO2 23 22 - 32 mmol/L   Glucose, Bld 229 (H) 70 - 99 mg/dL    Comment: Please note change in reference range.   BUN 12 8 -  23 mg/dL    Comment: Please note change in reference range.   Creatinine, Ser 0.72 0.61 - 1.24 mg/dL   Calcium 8.8 (L) 8.9 - 10.3 mg/dL   GFR calc non Af Amer >60 >60 mL/min   GFR calc Af Amer >60 >60 mL/min    Comment: (NOTE) The eGFR has been calculated using the  CKD EPI equation. This calculation has not been validated in all clinical situations. eGFR's persistently <60 mL/min signify possible Chronic Kidney Disease.    Anion gap 12 5 - 15    Comment: Performed at [facility], [address], [place], [place] [address]  Glucose, capillary     Status: Abnormal   Collection Time: 03/08/18  7:42 AM  Result Value Ref Range   Glucose-Capillary 255 (H) 70 - 99 mg/dL  Surgical pcr screen     Status: None   Collection Time: 03/08/18  9:25 AM  Result Value Ref Range   MRSA, PCR NEGATIVE NEGATIVE   Staphylococcus aureus NEGATIVE NEGATIVE    Comment: (NOTE) The Xpert SA Assay (FDA approved for NASAL specimens in patients 13 years of age and older), is one component of a comprehensive surveillance program. It is not intended to diagnose infection nor to guide or monitor treatment. Performed at [facility], [address] [place], [place] [address]   Glucose, capillary     Status: Abnormal   Collection Time: 03/08/18 11:15 AM  Result Value Ref Range   Glucose-Capillary 228 (H) 70 - 99 mg/dL    Dg Elbow Complete Right  Result Date: 03/07/2018 CLINICAL DATA:  Right elbow pain after fall yesterday. EXAM: RIGHT ELBOW - COMPLETE 3+ VIEW COMPARISON:  None. FINDINGS: There is no evidence of fracture, dislocation, or joint effusion. There is no evidence of arthropathy or other focal bone abnormality. Soft tissues are unremarkable. IMPRESSION: Negative. Electronically Signed   By: Titus Dubin M.D.   On: 03/07/2018 15:07   Ct Hip Right Wo Contrast  Result Date: 03/07/2018 CLINICAL DATA:  Right hip pain after fall yesterday. Possible fracture on x-ray. EXAM: CT OF THE RIGHT HIP WITHOUT CONTRAST TECHNIQUE: Multidetector CT imaging of the right hip was performed according to the standard protocol. Multiplanar CT image reconstructions were also generated. COMPARISON:  Right hip x-rays from same day. FINDINGS: Bones/Joint/Cartilage Acute, minimally impacted  fracture of the right subcapital femoral neck. The right hip joint space is relatively preserved. No joint effusion. Ligaments Suboptimally assessed by CT. Muscles and Tendons No muscle atrophy. The gluteal, iliopsoas, and hamstring tendons are grossly intact. Soft tissues Mild soft tissue swelling over the greater trochanter. No soft tissue mass or fluid collection. IMPRESSION: 1. Acute, minimally impacted fracture of the right subcapital femoral neck. Electronically Signed   By: Titus Dubin M.D.   On: 03/07/2018 15:46   Dg Chest Port 1 View  Result Date: 03/07/2018 CLINICAL DATA:  Fall yesterday.  Hip fracture EXAM: PORTABLE CHEST 1 VIEW COMPARISON:  02/08/2018 FINDINGS: Improved aeration in the bases since prior study with minimal residual atelectasis. Negative for heart failure or effusion. IMPRESSION: Improvement in mild bibasilar atelectasis since the prior study. No new findings. Electronically Signed   By: Franchot Gallo M.D.   On: 03/07/2018 17:21   Dg Knee Complete 4 Views Right  Result Date: 03/07/2018 CLINICAL DATA:  Right knee pain after fall yesterday. EXAM: RIGHT KNEE - COMPLETE 4+ VIEW COMPARISON:  None. FINDINGS: No acute fracture or dislocation. No joint effusion. Joint spaces are preserved. Tiny marginal patellar  osteophytes. Bone mineralization is normal. Vascular calcifications. Small radiopaque density just underneath the anterior skin surface along the distal quadriceps tendon. IMPRESSION: 1.  No acute osseous abnormality. 2. Small radiopaque density just underneath the anterior skin surface along the distal quadriceps tendon. Correlate for foreign body. Electronically Signed   By: Titus Dubin M.D.   On: 03/07/2018 15:09   Dg Hip Unilat  With Pelvis 2-3 Views Right  Result Date: 03/07/2018 CLINICAL DATA:  Right hip pain after fall yesterday. EXAM: DG HIP (WITH OR WITHOUT PELVIS) 2-3V RIGHT COMPARISON:  CT abdomen pelvis dated September 28, 2004. FINDINGS: Evaluation of the  right hip is limited by external rotation. Questionable irregularity of the right inferior femoral head/neck junction. No dislocation. The pubic symphysis and sacroiliac joints are intact. The bilateral hip joint spaces are maintained. Bone mineralization is normal. Soft tissues are unremarkable. IMPRESSION: Evaluation of the right hip is limited by external rotation. Questionable irregularity of the right inferior femoral head/neck junction may reflect fracture. Recommend CT right hip for further evaluation. Electronically Signed   By: Titus Dubin M.D.   On: 03/07/2018 15:06    Review of Systems  Constitutional: Positive for weight loss. Negative for fever and malaise/fatigue.  HENT: Negative.   Respiratory: Positive for shortness of breath.   Cardiovascular: Negative for chest pain.  Gastrointestinal: Positive for abdominal pain.  Psychiatric/Behavioral: Positive for memory loss.   Blood pressure (!) 159/64, pulse (!) 108, temperature 98.3 F (36.8 C), temperature source Oral, resp. rate 20, height _0  (1.702 m), weight 122 lb 2.2 oz (55.4 kg), SpO2 98 %. Physical Exam  Constitutional: He is oriented to person, place, and time. He appears well-developed and well-nourished. No distress.  HENT:  Head: Normocephalic and atraumatic.  Right Ear: External ear normal.  Left Ear: External ear normal.  Nose: Nose normal.  Mouth/Throat: No oropharyngeal exudate.  Eyes: Pupils are equal, round, and reactive to light. Conjunctivae and EOM are normal. Right eye exhibits no discharge. Left eye exhibits no discharge. No scleral icterus.  Neck: Normal range of motion. Neck supple. No JVD present. No tracheal deviation present. No thyromegaly present.  Cardiovascular: Normal rate, normal heart sounds and intact distal pulses.  Respiratory: Effort normal. No respiratory distress. He has no wheezes. He exhibits no tenderness.  GI: Soft. Bowel sounds are normal. He exhibits no distension.   Musculoskeletal:  Right upper extremity left upper extremity alignment normal no range of motion deficits no instability or subluxation muscle tone and strength normal skin intact pulses good lymph nodes negative sensation normal  Left lower extremity normal alignment range of motion stability strength skin pulse sensation.  Right lower extremity alignment was normal painful range of motion all planes tenderness proximal hip no instability muscle tone and strength normal skin intact pulses good lymph nodes negative  No pathologic reflexes normal reflexes throughout coordination and balance normal  Lymphadenopathy:       Right cervical: No superficial cervical adenopathy present.      Left cervical: No superficial cervical adenopathy present.  Neurological: He is alert and oriented to person, place, and time. He has normal reflexes. He displays normal reflexes. No cranial nerve deficit. He exhibits normal muscle tone. Coordination normal.  Skin: Skin is warm and dry. No rash noted. He is not diaphoretic. No erythema.  Psychiatric: He has a normal mood and affect. His behavior is normal. Judgment and thought content normal.    Assessment/Plan: Right hip fracture nondisplaced CT scan confirmed fracture  Recommend cannulated screws hip    Arther Abbott 03/08/2018, 4:13 PM

## 2018-03-08 NOTE — Progress Notes (Signed)
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  The patient's wife and son reported that the patient has developed shuffling gait.  No prior diagnosis of Parkinson's disease.  No prior cardiac history.  Patient was admitted recently to this hospital (last month) with likely pneumonic process.  The patient is said to be fairly active, and was mowing his lawn prior to the fall.  Patient fell at home yesterday, after his legs became entangled on the stool he was resting his legs on.  Patient was still able to ambulate afterwards, but with difficulty, and pain and some limping.  No loss of consciousness.  Patient presents with worsening right hip pain.  On presentation to the ER, CT scan of the hip done without contrast revealed acute minimally impacted subcapital right femoral neck fracture.    Assessment & Plan: 1-right neck femoral head fracture -Continue supportive care and as needed analgesic -Orthopedic surgery has been involved and planning ORIF most likely 03/09/2018 -PT/OT to follow after surgical intervention to determine safe plan for discharge.  2-adenocarcinoma of the lung -continue outpatient follow up -patient continue smoking   3-glaucoma -continue left eye drops  -denies ocular pain  4-type 2 diabetes: with hyperglycemia -continue SSI -A1C 7.9  5-tobacco abuse -I have discussed tobacco cessation with the patient.  I have counseled the patient regarding the negative impacts of continued tobacco use including but not limited to lung cancer, COPD, and cardiovascular disease.  I have discussed  alternatives to tobacco and modalities that may help facilitate tobacco cessation including but not limited to biofeedback, hypnosis, and medications.  Total time spent with tobacco counseling was 5 minutes. -continue nicotine patch   6-chronic pain -continue MSIR as previously prescribed -PRN dilaudid for severe pain and better control due to hip fracture  7-hypokalemia  -repleted and WNL now -follow trend   DVT prophylaxis: SCD's in anticipation for surgery Code Status: DNR Family Communication: Daughter at bedside Disposition Plan: To be determined after surgical procedure is done.  For now remain inpatient, continue analgesic therapy and supportive care.  Consultants:   Orthopedic service   Procedures:   Anticipated ORIF by orthopedic service  Antimicrobials:  Anti-infectives (From admission, onward)   None       Subjective: Afebrile, no chest pain, no shortness of breath, no nausea vomiting. Patient complaining of right hip pain.  Objective: Vitals:   03/07/18 2132 03/07/18 2146 03/08/18 0544 03/08/18 1430  BP: (!) 175/88  (!) 159/64 126/75  Pulse: 86  (!) 108 82  Resp: 20  20 16   Temp: 98.6 F (37 C)  98.3 F (36.8 C) 98.3 F (36.8 C)  TempSrc: Oral  Oral Oral  SpO2: 98%  98% 98%  Weight:  63 kg (138 lb 14.2 oz) 55.4 kg (122 lb 2.2 oz)   Height:  5\' 7"  (1.702 m) 5\' 7"  (1.702 m)     Intake/Output Summary (Last 24 hours) at 03/08/2018 1838 Last data filed at 03/07/2018 2140 Gross per 24 hour  Intake 240 ml  Output 175 ml  Net 65 ml   Filed Weights   03/07/18 1355 03/07/18 2146 03/08/18  0544  Weight: 65.3 kg (144 lb) 63 kg (138 lb 14.2 oz) 55.4 kg (122 lb 2.2 oz)    Examination: General exam: Alert, awake, oriented x 2; no fever; no CP no SOB. Reports pain on his right hip. Respiratory system: Clear to auscultation. Respiratory effort normal. Cardiovascular system:RRR. No murmurs, rubs, gallops. Gastrointestinal system: Abdomen is nondistended, soft  and nontender. No organomegaly or masses felt. Normal bowel sounds heard. Central nervous system: Alert and oriented X2. No focal neurological deficits. CN grossly intact Extremities: No Cyanosis or clubbing; no petechiae. Decrease mobility and pain on right leg  Skin: No rashes, lesions or open ulcers Psychiatry: poor Judgement; Mood & affect appropriate currently.   Data Reviewed: I have personally reviewed following labs and imaging studies  CBC: Recent Labs  Lab 03/07/18 1622  WBC 8.9  NEUTROABS 6.7  HGB 12.4*  HCT 36.1*  MCV 88.7  PLT 063   Basic Metabolic Panel: Recent Labs  Lab 03/07/18 1622 03/08/18 0409  NA 136 136  K 3.4* 3.6  CL 102 101  CO2 25 23  GLUCOSE 200* 229*  BUN 17 12  CREATININE 0.79 0.72  CALCIUM 8.7* 8.8*  MG 1.6*  --   PHOS 2.9  --    GFR: Estimated Creatinine Clearance: 65.4 mL/min (by C-G formula based on SCr of 0.72 mg/dL).  Coagulation Profile: Recent Labs  Lab 03/07/18 1622  INR 1.03   HbA1C: Recent Labs    03/07/18 1622  HGBA1C 7.9*   CBG: Recent Labs  Lab 03/07/18 2133 03/08/18 0742 03/08/18 1115 03/08/18 1637  GLUCAP 307* 255* 228* 173*   Urine analysis:    Component Value Date/Time   COLORURINE YELLOW 03/07/2018 2009   APPEARANCEUR CLEAR 03/07/2018 2009   LABSPEC 1.014 03/07/2018 2009   PHURINE 6.0 03/07/2018 2009   GLUCOSEU 150 (A) 03/07/2018 2009   HGBUR NEGATIVE 03/07/2018 2009   [place] NEGATIVE 03/07/2018 2009   [place] NEGATIVE 03/07/2018 2009   PROTEINUR 30 (A) 03/07/2018 2009   NITRITE NEGATIVE 03/07/2018 2009   LEUKOCYTESUR NEGATIVE 03/07/2018 2009    Recent Results (from the past 240 hour(s))  Surgical pcr screen     Status: None   Collection Time: 03/08/18  9:25 AM  Result Value Ref Range Status   MRSA, PCR NEGATIVE NEGATIVE Final   Staphylococcus aureus NEGATIVE NEGATIVE Final    Comment: (NOTE) The Xpert SA Assay (FDA approved for NASAL specimens in patients 29 years of age and  older), is one component of a comprehensive surveillance program. It is not intended to diagnose infection nor to guide or monitor treatment. Performed at [facility], [address] [place], [place] [address]      Radiology Studies: Dg Elbow Complete Right  Result Date: 03/07/2018 CLINICAL DATA:  Right elbow pain after fall yesterday. EXAM: RIGHT ELBOW - COMPLETE 3+ VIEW COMPARISON:  None. FINDINGS: There is no evidence of fracture, dislocation, or joint effusion. There is no evidence of arthropathy or other focal bone abnormality. Soft tissues are unremarkable. IMPRESSION: Negative. Electronically Signed   By: Titus Dubin M.D.   On: 03/07/2018 15:07   Ct Hip Right Wo Contrast  Result Date: 03/07/2018 CLINICAL DATA:  Right hip pain after fall yesterday. Possible fracture on x-ray. EXAM: CT OF THE RIGHT HIP WITHOUT CONTRAST TECHNIQUE: Multidetector CT imaging of the right hip was performed according to the standard protocol. Multiplanar CT image reconstructions were also generated. COMPARISON:  Right hip x-rays from same day. FINDINGS: Bones/Joint/Cartilage Acute, minimally  impacted fracture of the right subcapital femoral neck. The right hip joint space is relatively preserved. No joint effusion. Ligaments Suboptimally assessed by CT. Muscles and Tendons No muscle atrophy. The gluteal, iliopsoas, and hamstring tendons are grossly intact. Soft tissues Mild soft tissue swelling over the greater trochanter. No soft tissue mass or fluid collection. IMPRESSION: 1. Acute, minimally impacted fracture of the right subcapital femoral neck. Electronically Signed   By: Titus Dubin M.D.   On: 03/07/2018 15:46   Dg Chest Port 1 View  Result Date: 03/07/2018 CLINICAL DATA:  Fall yesterday.  Hip fracture EXAM: PORTABLE CHEST 1 VIEW COMPARISON:  02/08/2018 FINDINGS: Improved aeration in the bases since prior study with minimal residual atelectasis. Negative for heart failure or effusion. IMPRESSION:  Improvement in mild bibasilar atelectasis since the prior study. No new findings. Electronically Signed   By: Franchot Gallo M.D.   On: 03/07/2018 17:21   Dg Knee Complete 4 Views Right  Result Date: 03/07/2018 CLINICAL DATA:  Right knee pain after fall yesterday. EXAM: RIGHT KNEE - COMPLETE 4+ VIEW COMPARISON:  None. FINDINGS: No acute fracture or dislocation. No joint effusion. Joint spaces are preserved. Tiny marginal patellar osteophytes. Bone mineralization is normal. Vascular calcifications. Small radiopaque density just underneath the anterior skin surface along the distal quadriceps tendon. IMPRESSION: 1.  No acute osseous abnormality. 2. Small radiopaque density just underneath the anterior skin surface along the distal quadriceps tendon. Correlate for foreign body. Electronically Signed   By: Titus Dubin M.D.   On: 03/07/2018 15:09   Dg Hip Unilat  With Pelvis 2-3 Views Right  Result Date: 03/07/2018 CLINICAL DATA:  Right hip pain after fall yesterday. EXAM: DG HIP (WITH OR WITHOUT PELVIS) 2-3V RIGHT COMPARISON:  CT abdomen pelvis dated September 28, 2004. FINDINGS: Evaluation of the right hip is limited by external rotation. Questionable irregularity of the right inferior femoral head/neck junction. No dislocation. The pubic symphysis and sacroiliac joints are intact. The bilateral hip joint spaces are maintained. Bone mineralization is normal. Soft tissues are unremarkable. IMPRESSION: Evaluation of the right hip is limited by external rotation. Questionable irregularity of the right inferior femoral head/neck junction may reflect fracture. Recommend CT right hip for further evaluation. Electronically Signed   By: Titus Dubin M.D.   On: 03/07/2018 15:06   Scheduled Meds: . aspirin EC  81 mg Oral Daily  . insulin aspart  0-5 Units Subcutaneous QHS  . insulin aspart  0-9 Units Subcutaneous TID WC  . ketorolac  1 drop Left Eye Q4H while awake  . multivitamin-lutein  1 capsule Oral Daily   . traMADol  50 mg Oral Q6H   Continuous Infusions:   LOS: 1 day    Time spent: 30 minutes    Barton Dubois, MD [facility] Pager [phone]  If 7PM-7AM, please contact night-coverage [email or web address] Password [facility] 03/08/2018, 6:38 PM

## 2018-03-08 NOTE — Progress Notes (Signed)
Inpatient Diabetes Program Recommendations  AACE/ADA: New Consensus Statement on Inpatient Glycemic Control (2015)  Target Ranges:  Prepandial:   less than 140 mg/dL      Peak postprandial:   less than 180 mg/dL (1-2 hours)      Critically ill patients:  140 - 180 mg/dL   Results for EVERRETT, Julian Woods (MRN [id number]) as of 03/08/2018 12:12  Ref. Range 02/06/2018 13:00 02/08/2018 05:09 02/09/2018 05:02 03/07/2018 16:22 03/08/2018 04:09  Glucose Latest Ref Range: 70 - 99 mg/dL 85 199 (H) 183 (H) 200 (H) 229 (H)   03/08/18 Home diabetes meds: 5 units of  70/30 tid plus correction.  Currently on sensitive correction tidac and hs.  Please consider adding 10 units of Levemir.  [place], CDE. M.Ed. Pager [phone] Inpatient Diabetes Coordinator

## 2018-03-08 NOTE — Progress Notes (Signed)
Patient sitting at edge of bed, trying to stand up, refusing to lay down in the bed. Patient confused and agitated. Patient states that he has not received any pain medications all night when he has. This RN staying in room with patient at this time to ensure safety. Manuella Ghazi, MD paged shortly before 0700 for patient's restlessness and severe pain. No response, so Dyann Kief, MD paged for notification. Will continue to monitor.

## 2018-03-08 NOTE — Clinical Social Work Note (Signed)
Patient and wife have made the decision to go home with HHPT services at discharge.  CM aware.  LCSW left a message for spiritual care regarding notarizing advance directives.   LCSW signing off.     Nyjae Hodge, [name], LCSW

## 2018-03-08 NOTE — Progress Notes (Signed)
Pt very agitated this morning and claims he did not get any pain medicine last night, but documented he got 4 doses. He takes morphine PO at home already for cancer pain. Wife at bedside calming patient. Will continue to monitor.

## 2018-03-08 NOTE — Progress Notes (Signed)
Department director, Patric Dykes, spoke with pt and wife and has given patient and wife permission to get pt into a wheelchair and get some air.

## 2018-03-09 ENCOUNTER — Encounter (HOSPITAL_COMMUNITY): Payer: Self-pay | Admitting: Anesthesiology

## 2018-03-09 ENCOUNTER — Inpatient Hospital Stay (HOSPITAL_COMMUNITY): Payer: Medicare Other | Admitting: Anesthesiology

## 2018-03-09 ENCOUNTER — Inpatient Hospital Stay (HOSPITAL_COMMUNITY): Payer: Medicare Other

## 2018-03-09 ENCOUNTER — Encounter (HOSPITAL_COMMUNITY)
Admission: EM | Disposition: A | Discharge status: Home Health | Payer: Self-pay | Source: Home / Self Care | Attending: Internal Medicine

## 2018-03-09 DIAGNOSIS — S72011A Unspecified intracapsular fracture of right femur, initial encounter for closed fracture: Principal | ICD-10-CM

## 2018-03-09 HISTORY — PX: HIP PINNING,CANNULATED: SHX1758

## 2018-03-09 LAB — BASIC METABOLIC PANEL
Anion gap: 8 (ref 5–15)
BUN: 19 mg/dL (ref 8–23)
CALCIUM: 8.7 mg/dL — AB (ref 8.9–10.3)
CO2: 27 mmol/L (ref 22–32)
Chloride: 100 mmol/L (ref 98–111)
Creatinine, Ser: 0.8 mg/dL (ref 0.61–1.24)
Glucose, Bld: 174 mg/dL — ABNORMAL HIGH (ref 70–99)
Potassium: 3.7 mmol/L (ref 3.5–5.1)
SODIUM: 135 mmol/L (ref 135–145)

## 2018-03-09 LAB — GLUCOSE, CAPILLARY
Glucose-Capillary: 217 mg/dL — ABNORMAL HIGH (ref 70–99)
Glucose-Capillary: 228 mg/dL — ABNORMAL HIGH (ref 70–99)
Glucose-Capillary: 146 mg/dL — ABNORMAL HIGH (ref 70–99)
Glucose-Capillary: 212 mg/dL — ABNORMAL HIGH (ref 70–99)
Glucose-Capillary: 251 mg/dL — ABNORMAL HIGH (ref 70–99)

## 2018-03-09 LAB — CBC
HCT: 34.8 % — ABNORMAL LOW (ref 39.0–52.0)
Hemoglobin: 11.6 g/dL — ABNORMAL LOW (ref 13.0–17.0)
MCH: 30.1 pg (ref 26.0–34.0)
MCHC: 33.3 g/dL (ref 30.0–36.0)
MCV: 90.4 fL (ref 78.0–100.0)
Platelets: 286 10*3/uL (ref 150–400)
RBC: 3.85 MIL/uL — ABNORMAL LOW (ref 4.22–5.81)
RDW: 13.3 % (ref 11.5–15.5)
WBC: 8.7 10*3/uL (ref 4.0–10.5)

## 2018-03-09 SURGERY — FIXATION, FEMUR, NECK, PERCUTANEOUS, USING SCREW
Anesthesia: General | Site: Hip | Laterality: Right

## 2018-03-09 MED ORDER — HYDROMORPHONE HCL 1 MG/ML IJ SOLN
0.2500 mg | INTRAMUSCULAR | Status: DC | PRN
  Administered 2018-03-09 (×4): 0.5 mg via INTRAVENOUS
  Filled 2018-03-09 (×4): qty 0.5

## 2018-03-09 MED ORDER — ONDANSETRON HCL 4 MG/2ML IJ SOLN: 4.0000 mg | Freq: Once | INTRAMUSCULAR | Status: DC | PRN

## 2018-03-09 MED ORDER — PROPOFOL 10 MG/ML IV BOLUS
INTRAVENOUS | Status: AC
  Filled 2018-03-09: qty 20

## 2018-03-09 MED ORDER — DOCUSATE SODIUM 100 MG PO CAPS
100.0000 mg | ORAL_CAPSULE | Freq: Two times a day (BID) | ORAL | Status: DC
  Administered 2018-03-09 – 2018-03-10 (×2): 100 mg via ORAL
  Filled 2018-03-09 (×2): qty 1

## 2018-03-09 MED ORDER — MEPERIDINE HCL 50 MG/ML IJ SOLN: 6.2500 mg | INTRAMUSCULAR | Status: DC | PRN

## 2018-03-09 MED ORDER — METOCLOPRAMIDE HCL 5 MG/ML IJ SOLN: 5.0000 mg | Freq: Three times a day (TID) | INTRAMUSCULAR | Status: DC | PRN

## 2018-03-09 MED ORDER — BUPIVACAINE-EPINEPHRINE (PF) 0.5% -1:200000 IJ SOLN
INTRAMUSCULAR | Status: DC | PRN
  Administered 2018-03-09: 60 mL via PERINEURAL

## 2018-03-09 MED ORDER — LIDOCAINE HCL 1 % IJ SOLN
INTRAMUSCULAR | Status: DC | PRN
  Administered 2018-03-09: 40 mg via INTRADERMAL

## 2018-03-09 MED ORDER — ONDANSETRON HCL 4 MG/2ML IJ SOLN
INTRAMUSCULAR | Status: DC | PRN
  Administered 2018-03-09: 4 mg via INTRAVENOUS

## 2018-03-09 MED ORDER — LACTATED RINGERS IV SOLN
INTRAVENOUS | Status: DC
  Administered 2018-03-09: 1000 mL via INTRAVENOUS

## 2018-03-09 MED ORDER — METOCLOPRAMIDE HCL 10 MG PO TABS: 5.0000 mg | ORAL_TABLET | Freq: Three times a day (TID) | ORAL | Status: DC | PRN

## 2018-03-09 MED ORDER — ONDANSETRON HCL 4 MG PO TABS: 4.0000 mg | ORAL_TABLET | Freq: Four times a day (QID) | ORAL | Status: DC | PRN

## 2018-03-09 MED ORDER — FENTANYL CITRATE (PF) 100 MCG/2ML IJ SOLN
INTRAMUSCULAR | Status: DC | PRN
  Administered 2018-03-09 (×3): 25 ug via INTRAVENOUS

## 2018-03-09 MED ORDER — CEFAZOLIN SODIUM-DEXTROSE 2-4 GM/100ML-% IV SOLN
2.0000 g | INTRAVENOUS | Status: AC
  Administered 2018-03-09: 2 g via INTRAVENOUS
  Filled 2018-03-09: qty 100

## 2018-03-09 MED ORDER — ONDANSETRON HCL 4 MG/2ML IJ SOLN: 4.0000 mg | Freq: Four times a day (QID) | INTRAMUSCULAR | Status: DC | PRN

## 2018-03-09 MED ORDER — BUPIVACAINE-EPINEPHRINE (PF) 0.5% -1:200000 IJ SOLN
INTRAMUSCULAR | Status: AC
  Filled 2018-03-09: qty 60

## 2018-03-09 MED ORDER — PHENOL 1.4 % MT LIQD: 1.0000 | OROMUCOSAL | Status: DC | PRN

## 2018-03-09 MED ORDER — CHLORHEXIDINE GLUCONATE 4 % EX LIQD: 60.0000 mL | Freq: Once | CUTANEOUS | Status: DC

## 2018-03-09 MED ORDER — SODIUM CHLORIDE 0.9 % IR SOLN
Status: DC | PRN
  Administered 2018-03-09: 1000 mL

## 2018-03-09 MED ORDER — POVIDONE-IODINE 10 % EX SWAB: 2.0000 "application " | Freq: Once | CUTANEOUS | Status: DC

## 2018-03-09 MED ORDER — PROPOFOL 10 MG/ML IV BOLUS
INTRAVENOUS | Status: DC | PRN
  Administered 2018-03-09: 10 mg via INTRAVENOUS
  Administered 2018-03-09: 20 mg via INTRAVENOUS
  Administered 2018-03-09: 100 mg via INTRAVENOUS

## 2018-03-09 MED ORDER — MENTHOL 3 MG MT LOZG: 1.0000 | LOZENGE | OROMUCOSAL | Status: DC | PRN

## 2018-03-09 MED ORDER — INSULIN DETEMIR 100 UNIT/ML ~~LOC~~ SOLN
6.0000 [IU] | Freq: Every day | SUBCUTANEOUS | Status: DC
  Administered 2018-03-09: 6 [IU] via SUBCUTANEOUS
  Filled 2018-03-09 (×5): qty 0.06

## 2018-03-09 MED ORDER — FENTANYL CITRATE (PF) 250 MCG/5ML IJ SOLN
INTRAMUSCULAR | Status: AC
  Filled 2018-03-09: qty 5

## 2018-03-09 MED ORDER — KETOROLAC TROMETHAMINE 30 MG/ML IJ SOLN
30.0000 mg | Freq: Once | INTRAMUSCULAR | Status: AC | PRN
  Administered 2018-03-09: 30 mg via INTRAVENOUS
  Filled 2018-03-09 (×2): qty 1

## 2018-03-09 MED ORDER — CEFAZOLIN SODIUM-DEXTROSE 2-4 GM/100ML-% IV SOLN
2.0000 g | Freq: Four times a day (QID) | INTRAVENOUS | Status: AC
  Administered 2018-03-09 (×2): 2 g via INTRAVENOUS
  Filled 2018-03-09 (×2): qty 100

## 2018-03-09 MED ORDER — HYDROCODONE-ACETAMINOPHEN 7.5-325 MG PO TABS: 1.0000 | ORAL_TABLET | Freq: Once | ORAL | Status: DC | PRN

## 2018-03-09 MED ORDER — SODIUM CHLORIDE 0.9% FLUSH
INTRAVENOUS | Status: AC
  Filled 2018-03-09: qty 20

## 2018-03-09 MED ORDER — ASPIRIN EC 325 MG PO TBEC
325.0000 mg | DELAYED_RELEASE_TABLET | Freq: Every day | ORAL | Status: DC
  Administered 2018-03-10: 325 mg via ORAL
  Filled 2018-03-09: qty 1

## 2018-03-09 SURGICAL SUPPLY — 52 items
BIT DRILL 5 ACE CANN QC (BIT) ×3 IMPLANT
BLADE HEX COATED 2.75 (ELECTRODE) ×3 IMPLANT
BNDG GAUZE ELAST 4 BULKY (GAUZE/BANDAGES/DRESSINGS) ×3 IMPLANT
CHLORAPREP W/TINT 26ML (MISCELLANEOUS) ×3 IMPLANT
CLOTH BEACON ORANGE TIMEOUT ST (SAFETY) ×3 IMPLANT
COVER LIGHT HANDLE STERIS (MISCELLANEOUS) ×12 IMPLANT
COVER MAYO STAND XLG (DRAPE) ×2 IMPLANT
DECANTER SPIKE VIAL GLASS SM (MISCELLANEOUS) ×6 IMPLANT
DRAPE STERI IOBAN 125X83 (DRAPES) ×3 IMPLANT
DRESSING MEPILEX BORDER 6X8 (GAUZE/BANDAGES/DRESSINGS) ×1 IMPLANT
DRSG MEPILEX BORDER 4X12 (GAUZE/BANDAGES/DRESSINGS) ×2 IMPLANT
DRSG MEPILEX BORDER 6X8 (GAUZE/BANDAGES/DRESSINGS) ×3
GLOVE BIO SURGEON STRL SZ7 (GLOVE) ×4 IMPLANT
GLOVE BIOGEL PI IND STRL 7.0 (GLOVE) ×1 IMPLANT
GLOVE BIOGEL PI INDICATOR 7.0 (GLOVE) ×2
GLOVE ECLIPSE 6.5 STRL STRAW (GLOVE) ×2 IMPLANT
GLOVE SKINSENSE NS SZ8.0 LF (GLOVE) ×2
GLOVE SKINSENSE STRL SZ8.0 LF (GLOVE) ×1 IMPLANT
GLOVE SS N UNI LF 8.5 STRL (GLOVE) ×3 IMPLANT
GOWN STRL REUS W/TWL LRG LVL3 (GOWN DISPOSABLE) ×12 IMPLANT
GOWN STRL REUS W/TWL XL LVL3 (GOWN DISPOSABLE) ×3 IMPLANT
INST SET MAJOR BONE (KITS) ×3 IMPLANT
KIT BLADEGUARD II DBL (SET/KITS/TRAYS/PACK) ×3 IMPLANT
KIT TURNOVER KIT A (KITS) ×3 IMPLANT
MANIFOLD NEPTUNE II (INSTRUMENTS) ×3 IMPLANT
MARKER SKIN DUAL TIP RULER LAB (MISCELLANEOUS) ×3 IMPLANT
NDL HYPO 21X1.5 SAFETY (NEEDLE) ×1 IMPLANT
NDL SPNL 18GX3.5 QUINCKE PK (NEEDLE) ×1 IMPLANT
NEEDLE HYPO 21X1.5 SAFETY (NEEDLE) ×3 IMPLANT
NEEDLE SPNL 18GX3.5 QUINCKE PK (NEEDLE) ×3 IMPLANT
NS IRRIG 1000ML POUR BTL (IV SOLUTION) ×3 IMPLANT
PACK BASIC III (CUSTOM PROCEDURE TRAY) ×3
PACK SRG BSC III STRL LF ECLPS (CUSTOM PROCEDURE TRAY) ×1 IMPLANT
PAD ABD 5X9 TENDERSORB (GAUZE/BANDAGES/DRESSINGS) ×3 IMPLANT
PENCIL HANDSWITCHING (ELECTRODE) ×3 IMPLANT
PIN THREADED GUIDE ACE (PIN) ×9 IMPLANT
SCREW CANN 6.5 80MM (Screw) ×6 IMPLANT
SCREW CANN 6.5 90MM (Screw) ×3 IMPLANT
SCREW CANN LG 6.5 FLT 80X40 (Screw) IMPLANT
SCREW CANN LG 6.5 FLT 90X40 (Screw) IMPLANT
SET BASIN LINEN APH (SET/KITS/TRAYS/PACK) ×3 IMPLANT
SPONGE LAP 18X18 X RAY DECT (DISPOSABLE) ×3 IMPLANT
STAPLER VISISTAT 35W (STAPLE) ×3 IMPLANT
SUT BRALON NAB BRD #1 30IN (SUTURE) IMPLANT
SUT MNCRL 0 VIOLET CTX 36 (SUTURE) ×1 IMPLANT
SUT MON AB 2-0 CT1 36 (SUTURE) ×3 IMPLANT
SUT MONOCRYL 0 CTX 36 (SUTURE) ×2
SYR 30ML LL (SYRINGE) ×3 IMPLANT
SYR BULB IRRIGATION 50ML (SYRINGE) ×6 IMPLANT
TOWEL OR 17X26 4PK STRL BLUE (TOWEL DISPOSABLE) ×3 IMPLANT
WASHER ACECAN 6.5 (Washer) ×6 IMPLANT
YANKAUER SUCT BULB TIP 10FT TU (MISCELLANEOUS) ×3 IMPLANT

## 2018-03-09 NOTE — Transfer of Care (Signed)
Immediate Anesthesia Transfer of Care Note  Patient: Julian Woods  Procedure(s) Performed: INTERNAL FIXATION RIGHT HIP (Right Hip)  Patient Location: PACU  Anesthesia Type:General  Level of Consciousness: awake and patient cooperative  Airway & Oxygen Therapy: Patient Spontanous Breathing and non-rebreather face mask  Post-op Assessment: Report given to RN and Post -op Vital signs reviewed and stable  Post vital signs: Reviewed and stable  Last Vitals:  Vitals Value Taken Time  BP    Temp    Pulse 93 03/09/2018  3:38 PM  Resp    SpO2 84 % 03/09/2018  3:38 PM  Vitals shown include unvalidated device data.  Last Pain:  Vitals:   03/09/18 1255  TempSrc: Oral  PainSc: 8       Patients Stated Pain Goal: 6 (96/28/36 6294)  Complications: No apparent anesthesia complications

## 2018-03-09 NOTE — Anesthesia Procedure Notes (Signed)
Procedure Name: LMA Insertion Date/Time: 03/09/2018 2:17 PM Performed by: Vista Deck, CRNA Pre-anesthesia Checklist: Patient identified, Patient being monitored, Emergency Drugs available, Timeout performed and Suction available Patient Re-evaluated:Patient Re-evaluated prior to induction Oxygen Delivery Method: Circle System Utilized Preoxygenation: Pre-oxygenation with 100% oxygen Induction Type: IV induction Ventilation: Mask ventilation without difficulty LMA: LMA inserted LMA Size: 4.0 Number of attempts: 1 Placement Confirmation: positive ETCO2 and breath sounds checked- equal and bilateral Comments: Inserted by Lytle Butte CRNA.

## 2018-03-09 NOTE — Brief Op Note (Signed)
03/07/2018 - 03/09/2018  3:30 PM  PATIENT:  Julian Woods  72 y.o. male  PRE-OPERATIVE DIAGNOSIS:  right hip fracture  POST-OPERATIVE DIAGNOSIS:  right hip fracture  PROCEDURE:  Procedure(s): INTERNAL FIXATION RIGHT HIP (Right)   Implants cannulated screws from [employer] 3 titanium screws with 3 washers 6.5 millimeters screws with 40 mm threads  The patient was identified in preop surgical site was confirmed his right hip the right hip was marked the chart was reviewed he was taken to surgery.  Appropriate antibiotics were given he was given general anesthesia he was placed on the fracture table we padded the peroneal post we placed his right leg in traction in his left leg in a well leg holder which was padded  I brought the C arm in the fracture was reduced and valgus impacted position on both x-rays AP and lateral.  This was followed by sterile prep and drape and then timeout  Lateral incision was made over the greater trochanter extended distally subcutaneous tissue was divided down to fascia.  Fascia was split in line with skin incision.  Vastus lateralis was split intramuscular incision we coagulated all small vessels.  We used a periosteal elevator to expose the proximal femur.  We placed a Bennett retractor.  We placed a pin guide and placed 3 guide pins in the femoral head in an inverted triangle configuration and confirm position on AP and lateral x-ray  We measured each pin drilled the outer cortex and then placed the appropriate screw with a washer.  We took the pins out and took x-rays and they showed excellent position of the hardware and the fracture was still reduced.  Thorough irrigation was performed followed by closure with 0 Monocryl #1 Surgilon 0 Monocryl and staples.  We injected 30 cc subfascial 30 cc subcu of Marcaine with epinephrine  We placed a sterile dressing  The patient was extubated and taken to recovery room in stable condition  The postoperative  plan  Weight-bear as tolerated Staples out in 2 weeks Follow-up in 2 weeks X-ray in 2 weeks Aspirin DVT prevention    SURGEON:  Surgeon(s) and Role:    * Carole Civil, MD - Primary  PHYSICIAN ASSISTANT:   ASSISTANTS: betty ashley   ANESTHESIA:   general  EBL:  25 mL   BLOOD ADMINISTERED:none  DRAINS: none   LOCAL MEDICATIONS USED:  MARCAINE     SPECIMEN:  No Specimen  DISPOSITION OF SPECIMEN:  N/A  COUNTS:  YES  TOURNIQUET:  * No tourniquets in log *  DICTATION: .Dragon Dictation  PLAN OF CARE: Admit to inpatient   PATIENT DISPOSITION:  PACU - hemodynamically stable.   Delay start of Pharmacological VTE agent (>24hrs) due to surgical blood loss or risk of bleeding: not applicable

## 2018-03-09 NOTE — Progress Notes (Signed)
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  The patient's wife and son reported that the patient has developed shuffling gait.  No prior diagnosis of Parkinson's disease.  No prior cardiac history.  Patient was admitted recently to this hospital (last month) with likely pneumonic process.  The patient is said to be fairly active, and was mowing his lawn prior to the fall.  Patient fell at home yesterday, after his legs became entangled on the stool he was resting his legs on.  Patient was still able to ambulate afterwards, but with difficulty, and pain and some limping.  No loss of consciousness.  Patient presents with worsening right hip pain.  On presentation to the ER, CT scan of the hip done without contrast revealed acute minimally impacted subcapital right femoral neck fracture.    Assessment & Plan: 1-right neck femoral head fracture -Continue supportive care and as needed analgesic -Orthopedic surgery has been involved and planning for right hip pinning later today; will follow post operative recommendations.  -PT/OT to follow after surgical intervention to determine safe plan for discharge.  2-adenocarcinoma of the lung -continue outpatient follow up -patient continue smoking   3-glaucoma -continue left eye drops  -denies ocular pain or discomfort currently   4-type 2 diabetes: with hyperglycemia -continue SSI -CBG's in 200-260 range -will add levemir 6 units QHS for better sugar control. -A1C 7.9  5-tobacco abuse -extended discussion and counseling provided on 03/08/17 -will continue  nicotine patch   6-chronic pain -continue MSIR as previously prescribed -PRN dilaudid for severe pain  7-hypokalemia  -repleted and WNL now -follow trend intermittently   DVT prophylaxis: SCD's in anticipation for surgery Code Status: DNR Family Communication: Daughter at bedside Disposition Plan: To be determined after surgical procedure is done.  For now remain inpatient, continue analgesic therapy and supportive care.  Consultants:   Orthopedic service   Procedures:   Anticipated right hip pinning  by orthopedic service later today (6/27)  Antimicrobials:  Anti-infectives (From admission, onward)   Start     Dose/Rate Route Frequency Ordered Stop   03/10/18 0600  ceFAZolin (ANCEF) IVPB 2g/100 mL premix     2 g 200 mL/hr over 30 Minutes Intravenous On call to O.R. 03/09/18 1302 03/09/18 1435       Subjective: Afebrile, no chest pain, no shortness of breath, no nausea, no vomiting.  Patient reported, overall pain better controlled with current analgesic therapy.  Objective: Vitals:   03/08/18 2118 03/09/18 0649 03/09/18 1255 03/09/18 1300  BP: (!) 155/72 (!) 144/80 (!) 142/73   Pulse: 88 79 76   Resp: 18 16 17  (!) 21  Temp: 98.2 F (36.8 C) 97.7 F (36.5 C) 98.3 F (36.8 C)   TempSrc: Oral Oral Oral   SpO2: 93% 94% 96% 95%  Weight:      Height:       No intake or output data in the 24 hours ending 03/09/18 1445 Filed Weights   03/07/18 1355 03/07/18 2146 03/08/18 0544  Weight: 65.3 kg (144 lb) 63 kg (138 lb 14.2 oz) 55.4 kg (122 lb 2.2 oz)    Examination:  General exam: Alert, awake, oriented x 2; reports her pain is better controlled with current analgesic therapy and is denying chest pain or shortness of breath. Respiratory system: Clear to auscultation. Respiratory effort normal. Cardiovascular system:RRR. No murmurs, rubs, gallops. Gastrointestinal system: Abdomen is nondistended, soft and nontender. No organomegaly or masses felt. Normal bowel sounds  heard. Central nervous system: Alert and oriented X2. No focal neurological deficits. CN intact Extremities: No Cyanosis, no clubbing. Limited range of motion on his right leg due to hip pain.  Skin: No rashes, lesions or ulcers Psychiatry: Judgement and insight appear normal. Mood & affect appropriate.   Data Reviewed: I have personally reviewed following labs and imaging studies  CBC: Recent Labs  Lab 03/07/18 1622 03/09/18 0417  WBC 8.9 8.7  NEUTROABS 6.7  --   HGB 12.4* 11.6*  HCT 36.1* 34.8*  MCV 88.7 90.4  PLT 298 092   Basic Metabolic Panel: Recent Labs  Lab 03/07/18 1622 03/08/18 0409 03/09/18 0417  NA 136 136 135  K 3.4* 3.6 3.7  CL 102 101 100  CO2 25 23 27   GLUCOSE 200* 229* 174*  BUN 17 12 19   CREATININE 0.79 0.72 0.80  CALCIUM 8.7* 8.8* 8.7*  MG 1.6*  --   --   PHOS 2.9  --   --    GFR: Estimated Creatinine Clearance: 65.4 mL/min (by C-G formula based on SCr of 0.8 mg/dL).  Coagulation Profile: Recent Labs  Lab 03/07/18 1622  INR 1.03   HbA1C: Recent Labs    03/07/18 1622  HGBA1C 7.9*   CBG: Recent Labs  Lab 03/08/18 1115 03/08/18 1637 03/08/18 2116 03/09/18 0733 03/09/18 1106  GLUCAP 228* 173* 281* 217* 228*   Urine analysis:    Component Value Date/Time   COLORURINE YELLOW 03/07/2018 2009   APPEARANCEUR CLEAR 03/07/2018 2009   LABSPEC 1.014 03/07/2018 2009   PHURINE 6.0 03/07/2018 2009   GLUCOSEU 150 (A) 03/07/2018 2009   HGBUR NEGATIVE 03/07/2018 2009   [place] NEGATIVE 03/07/2018 2009   [place] NEGATIVE 03/07/2018 2009   PROTEINUR 30 (A) 03/07/2018 2009   NITRITE NEGATIVE 03/07/2018 2009   LEUKOCYTESUR NEGATIVE 03/07/2018 2009    Recent Results (from the past 240 hour(s))  Surgical pcr screen     Status: None   Collection Time: 03/08/18  9:25 AM  Result Value Ref Range Status   MRSA, PCR NEGATIVE NEGATIVE Final   Staphylococcus aureus NEGATIVE NEGATIVE Final    Comment: (NOTE) The Xpert SA Assay (FDA approved  for NASAL specimens in patients 33 years of age and older), is one component of a comprehensive surveillance program. It is not intended to diagnose infection nor to guide or monitor treatment. Performed at [facility], [address], [place], [place] [address]      Radiology Studies: Dg Elbow Complete Right  Result Date: 03/07/2018 CLINICAL DATA:  Right elbow pain after fall yesterday. EXAM: RIGHT ELBOW - COMPLETE 3+ VIEW COMPARISON:  None. FINDINGS: There is no evidence of fracture, dislocation, or joint effusion. There is no evidence of arthropathy or other focal bone abnormality. Soft tissues are unremarkable. IMPRESSION: Negative. Electronically Signed   By: Titus Dubin M.D.   On: 03/07/2018 15:07   Ct Hip Right Wo Contrast  Result Date: 03/07/2018 CLINICAL DATA:  Right hip pain after fall yesterday. Possible fracture on x-ray. EXAM: CT OF THE RIGHT HIP WITHOUT CONTRAST TECHNIQUE: Multidetector CT imaging of the right hip was performed according to the standard protocol. Multiplanar CT image  reconstructions were also generated. COMPARISON:  Right hip x-rays from same day. FINDINGS: Bones/Joint/Cartilage Acute, minimally impacted fracture of the right subcapital femoral neck. The right hip joint space is relatively preserved. No joint effusion. Ligaments Suboptimally assessed by CT. Muscles and Tendons No muscle atrophy. The gluteal, iliopsoas, and hamstring tendons are grossly intact. Soft tissues Mild soft tissue swelling over the greater trochanter. No soft tissue mass or fluid collection. IMPRESSION: 1. Acute, minimally impacted fracture of the right subcapital femoral neck. Electronically Signed   By: Titus Dubin M.D.   On: 03/07/2018 15:46   Dg Chest Port 1 View  Result Date: 03/07/2018 CLINICAL DATA:  Fall yesterday.  Hip fracture EXAM: PORTABLE CHEST 1 VIEW COMPARISON:  02/08/2018 FINDINGS: Improved aeration in the bases since prior study with minimal residual atelectasis.  Negative for heart failure or effusion. IMPRESSION: Improvement in mild bibasilar atelectasis since the prior study. No new findings. Electronically Signed   By: Franchot Gallo M.D.   On: 03/07/2018 17:21   Dg Knee Complete 4 Views Right  Result Date: 03/07/2018 CLINICAL DATA:  Right knee pain after fall yesterday. EXAM: RIGHT KNEE - COMPLETE 4+ VIEW COMPARISON:  None. FINDINGS: No acute fracture or dislocation. No joint effusion. Joint spaces are preserved. Tiny marginal patellar osteophytes. Bone mineralization is normal. Vascular calcifications. Small radiopaque density just underneath the anterior skin surface along the distal quadriceps tendon. IMPRESSION: 1.  No acute osseous abnormality. 2. Small radiopaque density just underneath the anterior skin surface along the distal quadriceps tendon. Correlate for foreign body. Electronically Signed   By: Titus Dubin M.D.   On: 03/07/2018 15:09   Dg Hip Unilat  With Pelvis 2-3 Views Right  Result Date: 03/07/2018 CLINICAL DATA:  Right hip pain after fall yesterday. EXAM: DG HIP (WITH OR WITHOUT PELVIS) 2-3V RIGHT COMPARISON:  CT abdomen pelvis dated September 28, 2004. FINDINGS: Evaluation of the right hip is limited by external rotation. Questionable irregularity of the right inferior femoral head/neck junction. No dislocation. The pubic symphysis and sacroiliac joints are intact. The bilateral hip joint spaces are maintained. Bone mineralization is normal. Soft tissues are unremarkable. IMPRESSION: Evaluation of the right hip is limited by external rotation. Questionable irregularity of the right inferior femoral head/neck junction may reflect fracture. Recommend CT right hip for further evaluation. Electronically Signed   By: Titus Dubin M.D.   On: 03/07/2018 15:06   Scheduled Meds: . [MAR Hold] aspirin EC  81 mg Oral Daily  . chlorhexidine  60 mL Topical Once  . [MAR Hold] insulin aspart  0-5 Units Subcutaneous QHS  . [MAR Hold] insulin aspart   0-9 Units Subcutaneous TID WC  . insulin detemir  6 Units Subcutaneous QHS  . [MAR Hold] ketorolac  1 drop Left Eye Q4H while awake  . [MAR Hold] multivitamin-lutein  1 capsule Oral Daily  . povidone-iodine  2 application Topical Once  . [MAR Hold] traMADol  50 mg Oral Q6H   Continuous Infusions: . lactated ringers 1,000 mL (03/09/18 1319)     LOS: 2 days    Time spent: 30 minutes    Barton Dubois, MD [facility] Pager [phone]  If 7PM-7AM, please contact night-coverage [email or web address] Password [facility] 03/09/2018, 2:45 PM

## 2018-03-09 NOTE — Op Note (Signed)
03/09/2018  3:30 PM  PATIENT:  Julian Woods  72 y.o. male  PRE-OPERATIVE DIAGNOSIS:  right hip fracture  POST-OPERATIVE DIAGNOSIS:  right hip fracture  PROCEDURE:  Procedure(s): INTERNAL FIXATION RIGHT HIP (Right) - [id number]  Implants cannulated screws from [employer] 3 titanium screws with 3 washers 6.5 millimeters screws with 40 mm threads  The patient was identified in preop surgical site was confirmed his right hip the right hip was marked the chart was reviewed he was taken to surgery.  Appropriate antibiotics were given he was given general anesthesia he was placed on the fracture table we padded the peroneal post we placed his right leg in traction in his left leg in a well leg holder which was padded  I brought the C arm in the fracture was reduced and valgus impacted position on both x-rays AP and lateral.  This was followed by sterile prep and drape and then timeout  Lateral incision was made over the greater trochanter extended distally subcutaneous tissue was divided down to fascia.  Fascia was split in line with skin incision.  Vastus lateralis was split intramuscular incision we coagulated all small vessels.  We used a periosteal elevator to expose the proximal femur.  We placed a Bennett retractor.  We placed a pin guide and placed 3 guide pins in the femoral head in an inverted triangle configuration and confirm position on AP and lateral x-ray  We measured each pin drilled the outer cortex and then placed the appropriate screw with a washer.  We took the pins out and took x-rays and they showed excellent position of the hardware and the fracture was still reduced.  Thorough irrigation was performed followed by closure with 0 Monocryl #1 Surgilon 0 Monocryl and staples.  We injected 30 cc subfascial 30 cc subcu of Marcaine with epinephrine  We placed a sterile dressing  The patient was extubated and taken to recovery room in stable condition  The postoperative  plan  Weight-bear as tolerated Staples out in 2 weeks Follow-up in 2 weeks X-ray in 2 weeks Aspirin DVT prevention    SURGEON:  Surgeon(s) and Role:    * Carole Civil, MD - Primary  PHYSICIAN ASSISTANT:   ASSISTANTS: betty ashley   ANESTHESIA:   general  EBL:  25 mL   BLOOD ADMINISTERED:none  DRAINS: none   LOCAL MEDICATIONS USED:  MARCAINE     SPECIMEN:  No Specimen  DISPOSITION OF SPECIMEN:  N/A  COUNTS:  YES  TOURNIQUET:  * No tourniquets in log *  DICTATION: .Dragon Dictation  PLAN OF CARE: Admit to inpatient   PATIENT DISPOSITION:  PACU - hemodynamically stable.   Delay start of Pharmacological VTE agent (>24hrs) due to surgical blood loss or risk of bleeding: not applicable

## 2018-03-09 NOTE — Progress Notes (Signed)
Inpatient Diabetes Program Recommendations  AACE/ADA: New Consensus Statement on Inpatient Glycemic Control (2015)  Target Ranges:  Prepandial:   less than 140 mg/dL      Peak postprandial:   less than 180 mg/dL (1-2 hours)      Critically ill patients:  140 - 180 mg/dL   Lab Results  Component Value Date   GLUCAP 217 (H) 03/09/2018   HGBA1C 7.9 (H) 03/07/2018   Results for MACLOVIO, HENSON (MRN [id number]) as of 03/09/2018 10:42  Ref. Range 03/08/2018 07:42 03/08/2018 11:15 03/08/2018 16:37 03/08/2018 21:16 03/09/2018 07:33  Glucose-Capillary Latest Ref Range: 70 - 99 mg/dL 255 (H) 228 (H) 173 (H) 281 (H) 217 (H)  Review of Glycemic Control  Diabetes history: DM 2 Outpatient Diabetes medications: 70/30 5 units tid, Novolog SSI Current orders for Inpatient glycemic control: Novolog 0-9 units tid, Novolog 0-5 qhs  A1c 7.9% on 6/25  Inpatient Diabetes Program Recommendations:    Glucose trends in the 200 range. Please consider Levemir 6-8 units (0.1units/kg).  Thanks,  Tama Headings RN, MSN, BC-ADM, [facility] Inpatient Diabetes Coordinator Team Pager [phone] (8a-5p)

## 2018-03-09 NOTE — Care Management (Signed)
Noted CM consult for potential home health needs. CM following for needs. Surgery planned for today.

## 2018-03-09 NOTE — Interval H&P Note (Signed)
History and Physical Interval Note:  03/09/2018 12:52 PM  Julian Woods  has presented today for surgery, with the diagnosis of right hip fracture  The various methods of treatment have been discussed with the patient and family. After consideration of risks, benefits and other options for treatment, the patient has consented to  Procedure(s): CANNULATED HIP PINNING (Right) as a surgical intervention .  The patient's history has been reviewed, patient examined, no change in status, stable for surgery.  I have reviewed the patient's chart and labs.  Questions were answered to the patient's satisfaction.     Arther Abbott

## 2018-03-09 NOTE — Anesthesia Postprocedure Evaluation (Signed)
Anesthesia Post Note  Patient: Julian Woods  Procedure(s) Performed: INTERNAL FIXATION RIGHT HIP (Right Hip)  Patient location during evaluation: PACU Anesthesia Type: General Level of consciousness: awake and alert and patient cooperative Pain management: satisfactory to patient Vital Signs Assessment: post-procedure vital signs reviewed and stable Respiratory status: spontaneous breathing and patient connected to nasal cannula oxygen Cardiovascular status: stable Postop Assessment: no apparent nausea or vomiting Anesthetic complications: no     Last Vitals:  Vitals:   03/09/18 1255 03/09/18 1300  BP: (!) 142/73   Pulse: 76   Resp: 17 (!) 21  Temp: 36.8 C   SpO2: 96% 95%    Last Pain:  Vitals:   03/09/18 1255  TempSrc: Oral  PainSc: 8                  Keyari Kleeman

## 2018-03-09 NOTE — Anesthesia Preprocedure Evaluation (Signed)
Anesthesia Evaluation  Patient identified by MRN, date of birth, ID band Patient awake    Reviewed: Allergy & Precautions, H&P , NPO status , Patient's Chart, lab work & pertinent test results  Airway Mallampati: II  TM Distance: >3 FB Neck ROM: full    Dental no notable dental hx.    Pulmonary neg pulmonary ROS, pneumonia, Current Smoker,    Pulmonary exam normal breath sounds clear to auscultation       Cardiovascular Exercise Tolerance: Good negative cardio ROS   Rhythm:regular Rate:Normal     Neuro/Psych negative neurological ROS  negative psych ROS   GI/Hepatic negative GI ROS, Neg liver ROS,   Endo/Other  negative endocrine ROSdiabetes  Renal/GU negative Renal ROS  negative genitourinary   Musculoskeletal   Abdominal   Peds  Hematology negative hematology ROS (+)   Anesthesia Other Findings   Reproductive/Obstetrics negative OB ROS                             Anesthesia Physical Anesthesia Plan  ASA: III  Anesthesia Plan: General   Post-op Pain Management:    Induction:   PONV Risk Score and Plan:   Airway Management Planned:   Additional Equipment:   Intra-op Plan:   Post-operative Plan:   Informed Consent: I have reviewed the patients History and Physical, chart, labs and discussed the procedure including the risks, benefits and alternatives for the proposed anesthesia with the patient or authorized representative who has indicated his/her understanding and acceptance.   Dental Advisory Given  Plan Discussed with: CRNA  Anesthesia Plan Comments:         Anesthesia Quick Evaluation

## 2018-03-09 NOTE — Brief Op Note (Signed)
03/07/2018 - 03/09/2018  3:26 PM  PATIENT:  Julian Woods  72 y.o. male  PRE-OPERATIVE DIAGNOSIS:  right hip fracture  POST-OPERATIVE DIAGNOSIS:  right hip fracture  PROCEDURE:  Procedure(s): INTERNAL FIXATION RIGHT HIP (Right)   Implants cannulated screws from [place] 3 titanium screws with 3 washers 6.5 millimeters screws with 40 mm threads  The patient was identified in preop surgical site was confirmed his right hip the right hip was marked the chart was reviewed he was taken to surgery.  Appropriate antibiotics were given he was given general anesthesia he was placed on the fracture table we padded the peroneal post we placed his right leg in traction in his left leg in a well leg holder which was padded  I brought the C arm in the fracture was reduced and valgus impacted position on both x-rays AP and lateral.  This was followed by sterile prep and drape and then timeout  Lateral incision was made over the greater trochanter extended distally subcutaneous tissue was divided down to fascia.  Fascia was split in line with skin incision.  Vastus lateralis was split intramuscular incision we coagulated all small vessels.  We used a periosteal elevator to expose the proximal femur.  We placed a Bennett retractor.  We placed a pin guide and placed 3 guide pins in the femoral head in an inverted triangle configuration and confirm position on AP and lateral x-ray  We measured each pin drilled the outer cortex and then placed the appropriate screw with a washer.  We took the pins out and took x-rays and they showed excellent position of the hardware and the fracture was still reduced.  Thorough irrigation was performed followed by closure with 0 Monocryl #1 Surgilon 0 Monocryl and staples.  We injected 30 cc subfascial 30 cc subcu of Marcaine with epinephrine  We placed a sterile dressing  The patient was extubated and taken to recovery room in stable condition  The postoperative  plan  Weight-bear as tolerated Staples out in 2 weeks Follow-up in 2 weeks X-ray in 2 weeks Aspirin DVT prevention    SURGEON:  Surgeon(s) and Role:    * Carole Civil, MD - Primary  PHYSICIAN ASSISTANT:   ASSISTANTS: betty ashley   ANESTHESIA:   general  EBL:  25 mL   BLOOD ADMINISTERED:none  DRAINS: none   LOCAL MEDICATIONS USED:  MARCAINE     SPECIMEN:  No Specimen  DISPOSITION OF SPECIMEN:  N/A  COUNTS:  YES  TOURNIQUET:  * No tourniquets in log *  DICTATION: .Dragon Dictation  PLAN OF CARE: Admit to inpatient   PATIENT DISPOSITION:  PACU - hemodynamically stable.   Delay start of Pharmacological VTE agent (>24hrs) due to surgical blood loss or risk of bleeding: not applicable

## 2018-03-10 ENCOUNTER — Encounter (HOSPITAL_COMMUNITY): Payer: Self-pay | Admitting: Orthopedic Surgery

## 2018-03-10 DIAGNOSIS — Z720 Tobacco use: Secondary | ICD-10-CM

## 2018-03-10 DIAGNOSIS — Z794 Long term (current) use of insulin: Secondary | ICD-10-CM

## 2018-03-10 DIAGNOSIS — C3490 Malignant neoplasm of unspecified part of unspecified bronchus or lung: Secondary | ICD-10-CM

## 2018-03-10 DIAGNOSIS — G894 Chronic pain syndrome: Secondary | ICD-10-CM

## 2018-03-10 LAB — BASIC METABOLIC PANEL
ANION GAP: 7 (ref 5–15)
BUN: 14 mg/dL (ref 8–23)
CHLORIDE: 100 mmol/L (ref 98–111)
CO2: 25 mmol/L (ref 22–32)
Calcium: 8.3 mg/dL — ABNORMAL LOW (ref 8.9–10.3)
Creatinine, Ser: 0.72 mg/dL (ref 0.61–1.24)
Glucose, Bld: 221 mg/dL — ABNORMAL HIGH (ref 70–99)
POTASSIUM: 3.7 mmol/L (ref 3.5–5.1)
SODIUM: 132 mmol/L — AB (ref 135–145)

## 2018-03-10 LAB — CBC
HCT: 33.6 % — ABNORMAL LOW (ref 39.0–52.0)
HEMOGLOBIN: 11.2 g/dL — AB (ref 13.0–17.0)
MCH: 29.6 pg (ref 26.0–34.0)
MCHC: 33.3 g/dL (ref 30.0–36.0)
MCV: 88.9 fL (ref 78.0–100.0)
Platelets: 291 10*3/uL (ref 150–400)
RBC: 3.78 MIL/uL — AB (ref 4.22–5.81)
RDW: 12.7 % (ref 11.5–15.5)
WBC: 10.5 10*3/uL (ref 4.0–10.5)

## 2018-03-10 LAB — GLUCOSE, CAPILLARY
Glucose-Capillary: 256 mg/dL — ABNORMAL HIGH (ref 70–99)
Glucose-Capillary: 298 mg/dL — ABNORMAL HIGH (ref 70–99)

## 2018-03-10 MED ORDER — CALCIUM CARBONATE ANTACID 500 MG PO CHEW
2.0000 | CHEWABLE_TABLET | Freq: Three times a day (TID) | ORAL | Status: DC | PRN
Start: 2018-03-10 — End: 2018-03-10
  Administered 2018-03-10: 400 mg via ORAL
  Filled 2018-03-10: qty 2

## 2018-03-10 MED ORDER — ASPIRIN 325 MG PO TBEC: 325.0000 mg | DELAYED_RELEASE_TABLET | Freq: Every day | ORAL | 3 refills | Status: AC

## 2018-03-10 MED ORDER — NICOTINE 21 MG/24HR TD PT24: 21.0000 mg | MEDICATED_PATCH | TRANSDERMAL | 1 refills | Status: AC

## 2018-03-10 MED ORDER — TRAMADOL HCL 50 MG PO TABS
50.0000 mg | ORAL_TABLET | ORAL | Status: DC | PRN
  Administered 2018-03-10 (×2): 50 mg via ORAL
  Filled 2018-03-10 (×2): qty 1

## 2018-03-10 MED ORDER — DOCUSATE SODIUM 100 MG PO CAPS: 100.0000 mg | ORAL_CAPSULE | Freq: Two times a day (BID) | ORAL | 0 refills | Status: DC

## 2018-03-10 NOTE — Clinical Social Work Note (Signed)
Patient's wife, Julian Woods, stated that patient continues to plan to go home with HHPT at discharge. She stated that patient worked with PT this morning and that he was able to ambulate to the bathroom, put on his shorts and sit in the chair. Patient is not interested in short term rehab at SNF at this time.   LCSW signing off.     Julian Woods, [name], LCSW

## 2018-03-10 NOTE — Progress Notes (Signed)
Robin removed IV-clean, dry, intact. Reviewed d/c paperwork with patient. Answered all questions. Wheeled stable patient and belongings to main entrance where he was picked up by wife.

## 2018-03-10 NOTE — Anesthesia Postprocedure Evaluation (Signed)
Anesthesia Post Note  Patient: Julian Woods  Procedure(s) Performed: INTERNAL FIXATION RIGHT HIP (Right Hip)  Patient location during evaluation: Nursing Unit Anesthesia Type: General Level of consciousness: awake and alert and oriented Pain management: pain level controlled Vital Signs Assessment: post-procedure vital signs reviewed and stable Respiratory status: spontaneous breathing Cardiovascular status: blood pressure returned to baseline and stable Postop Assessment: no apparent nausea or vomiting Anesthetic complications: no     Last Vitals:  Vitals:   03/10/18 0001 03/10/18 0409  BP: (!) 161/67 (!) 163/67  Pulse: 96 92  Resp:    Temp: (!) 36.4 C 36.5 C  SpO2: 96% 91%    Last Pain:  Vitals:   03/10/18 0847  TempSrc:   PainSc: 8                  Carisha Kantor

## 2018-03-10 NOTE — Progress Notes (Signed)
Patient requested Tramadol. He said he had some medication that he was going to take if I didn't give him some. Patient's wife talked with Dr. Aline Brochure about patient's home pain medication. She had his pain medication with her to show the doctor. Dr. Aline Brochure d/cd patient's dilaudid and prescribed tramadol q4. 30 minutes later I went into the room to give him his tramadol and he said he didn't need it and patient refused all morning medications.

## 2018-03-10 NOTE — Care Management Important Message (Signed)
Important Message  Patient Details  Name: Julian Woods MRN: [id number] Date of Birth: 1946/05/03   Medicare Important Message Given:  Yes    [name] 03/10/2018, 11:44 AM

## 2018-03-10 NOTE — Progress Notes (Signed)
Patient ID: Julian Woods, male   DOB: 30-Dec-1945, 72 y.o.   MRN: [id number] Postop day 1 status post open treatment internal fixation right hip with cannulated screws x3 for right femoral neck fracture  BP (!) 163/67 (BP Location: Left Arm)   Pulse 92   Temp 97.7 F (36.5 C) (Oral)   Resp 16   Ht 5\' 7"  (1.702 m)   Wt 122 lb 2.2 oz (55.4 kg)   SpO2 91%   BMI 19.13 kg/m   Patient complained of abdominal pain  Ambulated with physical therapy several steps  Recommend changing tramadol to every 4 as needed  DC IV opioids  If no improvement consult GI as well

## 2018-03-10 NOTE — Progress Notes (Signed)
Inpatient Diabetes Program Recommendations  AACE/ADA: New Consensus Statement on Inpatient Glycemic Control (2015)  Target Ranges:  Prepandial:   less than 140 mg/dL      Peak postprandial:   less than 180 mg/dL (1-2 hours)      Critically ill patients:  140 - 180 mg/dL   Results for Julian Woods, Julian Woods (MRN [id number]) as of 03/10/2018 08:25  Ref. Range 03/09/2018 07:33 03/09/2018 11:06 03/09/2018 15:42 03/09/2018 18:04 03/09/2018 20:01 03/10/2018 08:18  Glucose-Capillary Latest Ref Range: 70 - 99 mg/dL 217 (H) 228 (H) 146 (H) 212 (H) 251 (H) 256 (H)   Review of Glycemic Control  Diabetes history: DM 2 Outpatient Diabetes medications: 70/30 5 units tid, Novolog SSI Current orders for Inpatient glycemic control: Novolog 0-9 units tid, Novolog 0-5 qhs  A1c 7.9% on 6/25  Inpatient Diabetes Program Recommendations:    Fasting glucose 256 mg/dl after Levemir 6 units given. Consider increasing Levemir to 10-12 units. Patient may also need meal coverage if glucose increases after meals, Novolog 2 units tid to start if needed.  Thanks,  Tama Headings RN, MSN, BC-ADM, [facility] Inpatient Diabetes Coordinator Team Pager [phone] (8a-5p)

## 2018-03-10 NOTE — Care Management Note (Signed)
Case Management Note  Patient Details  Name: Julian Woods MRN: [id number] Date of Birth: July 04, 1946  Subjective/Objective:  S/p day 1 of ORIF of right hip. From home with wife. Ind at baseline. Recommended for [facility] PT, patient agreeable. No preference of agencies.                   Action/Plan:[facility] [place] notified and will obtain orders via Epic. Patient will need a RW also. Will have [place] deliver to room prior to DC.    Expected Discharge Date:   03/10/2018               Expected Discharge Plan:  [place]  In-House Referral:  Clinical Social Work  Discharge planning Services  CM Consult  Post Acute Care Choice:  Durable Medical Equipment, Home Health Choice offered to:  Patient  DME Arranged:  Walker rolling DME Agency:  [place]:  PT [facility] Agency:  [place]  Status of Service:  Completed, signed off  If discussed at [place] of Stay Meetings, dates discussed:    Additional Comments:  Keshawn Sundberg, Chauncey Reading, RN 03/10/2018, 11:58 AM

## 2018-03-10 NOTE — Plan of Care (Signed)
  Problem: Acute Rehab PT Goals(only PT should resolve) Goal: Patient Will Transfer Sit To/From Stand Outcome: Progressing Flowsheets (Taken 03/10/2018 1057) Patient will transfer sit to/from stand: with modified independence Note:  With RW Goal: Pt Will Transfer Bed To Chair/Chair To Bed Outcome: Progressing Flowsheets (Taken 03/10/2018 1057) Pt will Transfer Bed to Chair/Chair to Bed: with supervision Note:  With RW Goal: Pt Will Ambulate Outcome: Progressing Flowsheets (Taken 03/10/2018 1057) Pt will Ambulate: 25 feet;with min guard assist;with rolling walker

## 2018-03-10 NOTE — Addendum Note (Signed)
Addendum  created 03/10/18 1110 by Ollen Bowl, CRNA   Sign clinical note

## 2018-03-10 NOTE — Care Management (Signed)
Spoke with wife via phone. She is agreeable to plan. In route to pick patient up.

## 2018-03-10 NOTE — Evaluation (Addendum)
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  The patient's wife and son reported that the patient has developed shuffling gait.  No prior diagnosis of Parkinson's disease.  No prior cardiac history.  Patient was admitted recently to this hospital (last month) with likely pneumonic process.  The patient is said to be fairly active, and was mowing his lawn prior to the fall.  Patient fell at home yesterday, after his legs became entangled on the stool he was resting his legs on.  Patient was still able to ambulate afterwards, but with difficulty, and pain and some limping.  No loss of consciousness.  Patient presents with worsening right hip pain.  On presentation to the ER, CT scan of the hip done without contrast revealed acute minimally impacted subcapital right femoral neck fracture.        Clinical Impression  Julian Woods is a 72 y.o. presenting for PT evaluation with recent decrease in functional mobility secondary to fall resulting in Rt hip fracture. He is now POD 1 s/p ORIF to right femoral neck. He is currently functioning below his baseline of independent with no device, and now requires min assist for bed mobility and min assist for transfers/gait with use of RW. He has good caregiver/family support from his spouse and two adult sons who live close by and can assist occasionally as needed. He requires verbal cues to maintain safety and educate on appropriate use of RW to improve independence with mobility. He will benefit from skilled PT to address current impairments and from  follow up PT at below venue to address mobility improve strength to return to PLOF. He has a history of falls and would also benefit from follow up with outpatient physical therapy to improve balance and reduce fall risk. Acute PT will follow.     Follow Up Recommendations Home health PT    Equipment Recommendations  Rolling walker with 5" wheels    Recommendations for Other Services       Precautions / Restrictions Precautions Precautions: Fall Restrictions Weight Bearing Restrictions: No      Mobility  Bed Mobility Overal bed mobility: Needs Assistance Bed Mobility: Supine to Sit     Supine to sit: Min guard     General bed mobility comments: bed flat, patient using bed rail, minimal verbal cues required for sequencing transfer  Transfers Overall transfer level: Needs assistance Equipment used: Rolling walker (2 wheeled) Transfers: Sit to/from [facility] Sit to Stand: Min assist Stand pivot transfers: Min assist       General transfer comment: Verbal cues for safe use of RW, grab bars in bathroom , and hand placement for all transfers. Patient attempting to use lower leg cross bars for support during sit to stand transfers repeatedly and verbal cues required for patient to use RW in safe/appropriate manner  Ambulation/Gait Ambulation/Gait assistance: Min assist Gait Distance (Feet): 12 Feet(2 bouts) Assistive device: Rolling walker (2 wheeled) Gait Pattern/deviations: Decreased step length - right;Decreased stance time - right;Decreased stride length;Decreased weight shift to right;Antalgic   Gait velocity interpretation: <1.31 ft/sec, indicative of household ambulator General Gait Details: Min  assist for short distance of gait with verbal cues for safe management of RW       Balance Overall balance assessment: Needs assistance Sitting-balance support: No upper extremity supported;Feet supported Sitting balance-Leahy Scale: Fair Sitting  balance - Comments: patient able to don/doff socks and perform toileting but requires external support   Standing balance support: Bilateral upper extremity supported;During functional activity Standing balance-Leahy Scale: Poor Standing balance comment: patietn requires external support to maintain static/dynamic standing balance           Pertinent Vitals/Pain Pain Assessment: 0-10 Pain Score: 10-Worst pain ever Pain Location: abdomen  Pain Descriptors / Indicators: Discomfort;Constant Pain Intervention(s): Limited activity within patient's tolerance;Patient requesting pain meds-RN notified;Monitored during session    Home Living Family/patient expects to be discharged to:: Private residence Living Arrangements: Spouse/significant other Available Help at Discharge: Family Type of Home: House Home Access: Stairs to enter Entrance Stairs-Rails: Can reach both Entrance Stairs-Number of Steps: 2 Home Layout: Two level Home Equipment: None      Prior Function Level of Independence: Independent         Comments: patient was reportedly very active prior to his fall however he has had at least 2 falls in the last several months, with his recent fall resulting in Rt hip fracture     Hand Dominance        Extremity/Trunk Assessment   Upper Extremity Assessment Upper Extremity Assessment: Overall WFL for tasks assessed    Lower Extremity Assessment Lower Extremity Assessment: Overall WFL for tasks assessed(no specifically tested on Rt LE due to pain)    Cervical / Trunk Assessment Cervical / Trunk Assessment: Normal  Communication   Communication: No difficulties  Cognition Arousal/Alertness: Awake/alert Behavior During Therapy: WFL for tasks assessed/performed Overall Cognitive Status: Within Functional Limits for tasks assessed                Assessment/Plan    PT Assessment Patient needs continued PT services  PT Problem List Decreased strength;Decreased  knowledge of use of DME;Decreased activity tolerance;Decreased safety awareness;Decreased balance;Decreased mobility;Pain       PT Treatment Interventions DME instruction;Balance training;Gait training;Stair training;Functional mobility training;Patient/family education;Therapeutic activities;Therapeutic exercise    PT Goals (Current goals can be found in the Care Plan section)  Acute Rehab PT Goals Patient Stated Goal: to go back home and get stronger PT Goal Formulation: With patient/family Time For Goal Achievement: 03/17/18 Potential to Achieve Goals: Good    Frequency Min 4X/week   Barriers to discharge   patient will need RW       AM-PAC PT "6 Clicks" Daily Activity  Outcome Measure Difficulty turning over in bed (including adjusting bedclothes, sheets and blankets)?: A Little Difficulty moving from lying on back to sitting on the side of the bed? : A Little Difficulty sitting down on and standing up from a chair with arms (e.g., wheelchair, bedside commode, etc,.)?: Unable Help needed moving to and from a bed to chair (including a wheelchair)?: A Little Help needed walking in hospital room?: A Little Help needed climbing 3-5 steps with a railing? : A Lot 6 Click Score: 15    End of Session Equipment Utilized During Treatment: Gait belt Activity Tolerance: Patient tolerated treatment well Patient left: in bed;with nursing/sitter in room;with call bell/phone within reach;with family/visitor present Nurse Communication: Mobility status PT Visit Diagnosis: Unsteadiness on feet (R26.81);Other abnormalities of gait and mobility (R26.89);Difficulty in walking, not elsewhere classified (R26.2);Muscle weakness (generalized) (M62.81);History of falling (Z91.81)  Time: 5573-2202 PT Time Calculation (min) (ACUTE ONLY): 32 min   Charges:   PT Evaluation $PT Eval Low Complexity: 1 Low PT Treatments $Therapeutic Activity: 8-22 mins   PT G Codes:        Kipp Brood,  PT, DPT Physical Therapist with [facility]  03/10/2018 10:59 AM

## 2018-03-10 NOTE — Discharge Summary (Signed)
Physician Discharge Summary  Julian Woods WFU:[id number] DOB: 1945/10/19 DOA: 03/07/2018  PCP: [facility]  Admit date: 03/07/2018 Discharge date: 03/10/2018  Time spent: 35 minutes  Recommendations for Outpatient Follow-up:  1. Repeat basic metabolic panel to follow electrolytes 2. Close follow-up the patient's CBGs and further adjust hypoglycemic regimen as needed.   Discharge Diagnoses:  Active Problems:   Closed right hip fracture (HCC)   Tobacco abuse   Closed subcapital fracture of right femur (HCC)   Chronic pain syndrome hypokalemia   Discharge Condition: Stable and improved.  Patient tolerated ORIF procedures without complications.  Discharge home with home health PT and instruction to follow-up with orthopedic service in 2 weeks.  Diet recommendation: Modified carbohydrate diet.  Filed Weights   03/07/18 1355 03/07/18 2146 03/08/18 0544  Weight: 65.3 kg (144 lb) 63 kg (138 lb 14.2 oz) 55.4 kg (122 lb 2.2 oz)    History of present illness:  As per H&P written by Dr. Edd Fabian on 03/07/2018. 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  The patient's wife and son reported that the patient has developed shuffling gait.  No prior diagnosis of Parkinson's disease.  No prior cardiac history.  Patient was admitted recently to this hospital (last month) with likely pneumonic process.  The patient is said to be fairly active, and was mowing his lawn prior to the fall.  Patient fell at home yesterday, after his legs became entangled on the stool he was resting his legs on.  Patient was still able to ambulate afterwards, but with difficulty, and pain and some limping.  No loss of consciousness.  Patient presents with  worsening right hip pain.  On presentation to the ER, CT scan of the hip done without contrast revealed acute minimally impacted subcapital right femoral neck fracture.  As per the ER physician, the orthopedic team intends to proceed with surgery in a.m.  As mentioned above, no chest pain, no shortness of breath, no dyspnea on exertion, and the patient was reported to be fully functional.  No headache, no neck pain, no fever chills, no URI symptoms, no GI symptoms and no urinary symptoms.  Lab work done revealed potassium of 3.4, but patient seems to have chronically low potassium, as the patient was on potassium supplement prior to presentation.  Hemoglobin was 12.4 g/dL.  Patient was also on morphine prior to presentation on PRN basis, considering history of lung cancer.  Hospital Course:  1-right neck femoral head fracture -ORIF on 6/27, procedure well tolerated -will use full dose aspirin daily for DVT prophylaxis -continue MSIR and tramadol for pain control. -PT has assessed patient and recommended HHPT; services arranged through [facility].   -outpatient follow up with orthopedic service in 2 weeks.   2-adenocarcinoma of the lung -continue outpatient follow up with oncology service  -patient continue smoking   3-glaucoma -continue left eye drops  -patient denying ocular pain or discomfort currently   4-type 2 diabetes: with hyperglycemia -continue modified carb diet  -resume home hypoglycemic regimen -A1C 7.9  5-tobacco abuse -extended discussion and counseling provided on 03/08/17 -will continue nicotine patch at discharge -encouraged to quit smoking   6-chronic pain -continue MSIR and tramadol as previously prescribed  7-hypokalemia  -repleted and WNL at  discharge -Recommended basic metabolic panel at follow-up visit to assess electrolytes trend.    Procedures:  ORIF right hip 6/27  Consultations:  Orthopedic service  Discharge Exam: Vitals:    03/10/18 0001 03/10/18 0409  BP: (!) 161/67 (!) 163/67  Pulse: 96 92  Resp:    Temp: (!) 97.5 F (36.4 C) 97.7 F (36.5 C)  SpO2: 96% 91%    General exam: Alert, awake, oriented x 2; reports pain is well controlled. better controlled with current analgesic therapy and is denying chest pain or shortness of breath. Respiratory system: Clear to auscultation. Respiratory effort normal. Cardiovascular system:RRR. No murmurs, rubs, gallops. Gastrointestinal system: Abdomen is nondistended, soft and nontender. No organomegaly or masses felt. Normal bowel sounds heard. Central nervous system: Alert and oriented X2. No focal neurological deficits. CN intact Extremities: No Cyanosis, no clubbing. Limited range of motion on his right leg due to hip pain.  Skin: No rashes, lesions or ulcers Psychiatry: Judgement and insight appear normal. Mood & affect appropriate.    Discharge Instructions   Discharge Instructions    Diet - low sodium heart healthy   Complete by:  As directed    Discharge instructions   Complete by:  As directed    Take medications as prescribe d Keep yourself well hydrated  Follow up with Dr. Aline Brochure (orthopedic service) in 2 weeks Follow heart healthy diet and modified carbohydrates Stop smoking   Increase activity slowly   Complete by:  As directed      Allergies as of 03/10/2018      Reactions   Ventolin [albuterol] Anaphylaxis      Medication List    TAKE these medications   acetaminophen 325 MG tablet Commonly known as:  TYLENOL Take 650 mg by mouth every 6 (six) hours as needed for mild pain or moderate pain.   ARTIFICIAL TEAR OP Apply 1 drop to eye daily.   aspirin 325 MG EC tablet Take 1 tablet (325 mg total) by mouth daily with breakfast. Start taking on:  03/11/2018 What changed:    medication strength  how much to take  when to take this   docusate sodium 100 MG capsule Commonly known as:  COLACE Take 1 capsule (100 mg total) by mouth  2 (two) times daily.   insulin aspart protamine- aspart (70-30) 100 UNIT/ML injection Commonly known as:  NOVOLOG MIX 70/30 Inject 5 Units into the skin 3 (three) times daily.   ketorolac 0.5 % ophthalmic solution Commonly known as:  ACULAR Place 1 drop into the left eye every 4 (four) hours.   morphine 30 MG tablet Commonly known as:  MSIR Take 30-60 mg by mouth every 8 (eight) hours as needed for moderate pain or severe pain.   multivitamin-lutein Caps capsule Take 1 capsule by mouth daily.   nicotine 21 mg/24hr patch Commonly known as:  NICODERM CQ - dosed in mg/24 hours Place 1 patch (21 mg total) onto the skin daily.   NOVOLOG FLEXPEN 100 UNIT/ML FlexPen Generic drug:  insulin aspart Inject into the skin 3 (three) times daily with meals. Per sliding scale   PEPTO-BISMOL 262 MG/15ML suspension Generic drug:  bismuth subsalicylate Take 30 mLs by mouth every 6 (six) hours as needed (stomach).   potassium chloride SA 20 MEQ tablet Commonly known as:  K-DUR,KLOR-CON Take 1 tablet (20 mEq total) by mouth daily.   traMADol 50 MG tablet Commonly known as:  ULTRAM Take 100 mg by mouth 3 (three) times daily as needed  for moderate pain.            Durable Medical Equipment  (From admission, onward)        Start     Ordered   03/10/18 1203  For home use only DME Walker rolling  Once    Question:  Patient needs a walker to treat with the following condition  Answer:  S/P ORIF (open reduction internal fixation) fracture   03/10/18 1202   03/10/18 1203  For home use only DME Walker rolling  Once    Question:  Patient needs a walker to treat with the following condition  Answer:  Hip fracture ([place])   03/10/18 1203     Allergies  Allergen Reactions  . Ventolin [Albuterol] Anaphylaxis   Follow-up [facility]. Schedule an appointment as soon as possible for a visit in 1 week(s).   Specialty:  General Practice Why:  Go immediately. Contact  information: [place] [phone] [facility] Follow up.   Why:  home health PT- they will call you to set up first appt. time Contact information: [address] [place] [phone]       Carole Civil, MD. Schedule an appointment as soon as possible for a visit in 2 week(s).   Specialties:  Orthopedic Surgery, Radiology Contact information: [address] [place] [address] [phone]           The results of significant diagnostics from this hospitalization (including imaging, microbiology, ancillary and laboratory) are listed below for reference.    Significant Diagnostic Studies: Dg Elbow Complete Right  Result Date: 03/07/2018 CLINICAL DATA:  Right elbow pain after fall yesterday. EXAM: RIGHT ELBOW - COMPLETE 3+ VIEW COMPARISON:  None. FINDINGS: There is no evidence of fracture, dislocation, or joint effusion. There is no evidence of arthropathy or other focal bone abnormality. Soft tissues are unremarkable. IMPRESSION: Negative. Electronically Signed   By: Titus Dubin M.D.   On: 03/07/2018 15:07   Ct Hip Right Wo Contrast  Result Date: 03/07/2018 CLINICAL DATA:  Right hip pain after fall yesterday. Possible fracture on x-ray. EXAM: CT OF THE RIGHT HIP WITHOUT CONTRAST TECHNIQUE: Multidetector CT imaging of the right hip was performed according to the standard protocol. Multiplanar CT image reconstructions were also generated. COMPARISON:  Right hip x-rays from same day. FINDINGS: Bones/Joint/Cartilage Acute, minimally impacted fracture of the right subcapital femoral neck. The right hip joint space is relatively preserved. No joint effusion. Ligaments Suboptimally assessed by CT. Muscles and Tendons No muscle atrophy. The gluteal, iliopsoas, and hamstring tendons are grossly intact. Soft tissues Mild soft tissue swelling over the greater trochanter. No soft tissue mass or  fluid collection. IMPRESSION: 1. Acute, minimally impacted fracture of the right subcapital femoral neck. Electronically Signed   By: Titus Dubin M.D.   On: 03/07/2018 15:46   Dg Chest Port 1 View  Result Date: 03/07/2018 CLINICAL DATA:  Fall yesterday.  Hip fracture EXAM: PORTABLE CHEST 1 VIEW COMPARISON:  02/08/2018 FINDINGS: Improved aeration in the bases since prior study with minimal residual atelectasis. Negative for heart failure or effusion. IMPRESSION: Improvement in mild bibasilar atelectasis since the prior study. No new findings. Electronically Signed   By: Franchot Gallo M.D.   On: 03/07/2018 17:21   Dg Knee Complete 4 Views Right  Result Date: 03/07/2018 CLINICAL DATA:  Right knee pain after fall yesterday.  EXAM: RIGHT KNEE - COMPLETE 4+ VIEW COMPARISON:  None. FINDINGS: No acute fracture or dislocation. No joint effusion. Joint spaces are preserved. Tiny marginal patellar osteophytes. Bone mineralization is normal. Vascular calcifications. Small radiopaque density just underneath the anterior skin surface along the distal quadriceps tendon. IMPRESSION: 1.  No acute osseous abnormality. 2. Small radiopaque density just underneath the anterior skin surface along the distal quadriceps tendon. Correlate for foreign body. Electronically Signed   By: Titus Dubin M.D.   On: 03/07/2018 15:09   Dg Hip Operative Unilat W Or W/o Pelvis Right  Result Date: 03/09/2018 CLINICAL DATA:  Fixation right hip fracture. EXAM: OPERATIVE right HIP (WITH PELVIS IF PERFORMED) 5 VIEWS TECHNIQUE: Fluoroscopic spot image(s) were submitted for interpretation post-operatively. COMPARISON:  03/07/2018 FINDINGS: Examination demonstrates placement of 3 screws bridging the femoral neck from the subtrochanteric region into the femoral head intact and normally located. There is near anatomic alignment over the fracture site. IMPRESSION: Fixation of right femoral neck fracture with hardware intact and near anatomic  alignment over the fracture site. Electronically Signed   By: Marin Olp M.D.   On: 03/09/2018 16:17   Dg Hip Unilat  With Pelvis 2-3 Views Right  Result Date: 03/07/2018 CLINICAL DATA:  Right hip pain after fall yesterday. EXAM: DG HIP (WITH OR WITHOUT PELVIS) 2-3V RIGHT COMPARISON:  CT abdomen pelvis dated September 28, 2004. FINDINGS: Evaluation of the right hip is limited by external rotation. Questionable irregularity of the right inferior femoral head/neck junction. No dislocation. The pubic symphysis and sacroiliac joints are intact. The bilateral hip joint spaces are maintained. Bone mineralization is normal. Soft tissues are unremarkable. IMPRESSION: Evaluation of the right hip is limited by external rotation. Questionable irregularity of the right inferior femoral head/neck junction may reflect fracture. Recommend CT right hip for further evaluation. Electronically Signed   By: Titus Dubin M.D.   On: 03/07/2018 15:06    Microbiology: Recent Results (from the past 240 hour(s))  Surgical pcr screen     Status: None   Collection Time: 03/08/18  9:25 AM  Result Value Ref Range Status   MRSA, PCR NEGATIVE NEGATIVE Final   Staphylococcus aureus NEGATIVE NEGATIVE Final    Comment: (NOTE) The Xpert SA Assay (FDA approved for NASAL specimens in patients 6 years of age and older), is one component of a comprehensive surveillance program. It is not intended to diagnose infection nor to guide or monitor treatment. Performed at [facility], [address], [place], [place] [address]      Labs: Basic Metabolic Panel: Recent Labs  Lab 03/07/18 1622 03/08/18 0409 03/09/18 0417 03/10/18 0454  NA 136 136 135 132*  K 3.4* 3.6 3.7 3.7  CL 102 101 100 100  CO2 25 23 27 25   GLUCOSE 200* 229* 174* 221*  BUN 17 12 19 14   CREATININE 0.79 0.72 0.80 0.72  CALCIUM 8.7* 8.8* 8.7* 8.3*  MG 1.6*  --   --   --   PHOS 2.9  --   --   --    CBC: Recent Labs  Lab 03/07/18 1622 03/09/18 0417  03/10/18 0454  WBC 8.9 8.7 10.5  NEUTROABS 6.7  --   --   HGB 12.4* 11.6* 11.2*  HCT 36.1* 34.8* 33.6*  MCV 88.7 90.4 88.9  PLT 298 286 291   BNP (last 3 results) Recent Labs    02/06/18 1300  BNP 66.0   CBG: Recent Labs  Lab 03/09/18 1542 03/09/18 1804 03/09/18 2001  03/10/18 0818 03/10/18 1149  GLUCAP 146* 212* 251* 256* 298*    Signed:  Barton Dubois MD.  [facility] 03/10/2018, 12:21 PM

## 2018-03-14 ENCOUNTER — Telehealth: Payer: Self-pay | Admitting: Radiology

## 2018-03-14 NOTE — Telephone Encounter (Signed)
I spoke to physical therapy to give verbal orders for the therapy at home, 1 time this week, and 2 times for the next 3 weeks

## 2018-03-20 ENCOUNTER — Emergency Department (HOSPITAL_COMMUNITY)
Admission: EM | Admit: 2018-03-20 | Discharge: 2018-03-21 | Disposition: A | Discharge status: Home / Self Care | Payer: Medicare Other | Attending: Emergency Medicine | Admitting: Emergency Medicine

## 2018-03-20 ENCOUNTER — Other Ambulatory Visit: Payer: Self-pay

## 2018-03-20 ENCOUNTER — Emergency Department (HOSPITAL_COMMUNITY): Payer: Medicare Other

## 2018-03-20 ENCOUNTER — Encounter (HOSPITAL_COMMUNITY): Payer: Self-pay | Admitting: Emergency Medicine

## 2018-03-20 DIAGNOSIS — F17200 Nicotine dependence, unspecified, uncomplicated: Secondary | ICD-10-CM | POA: Insufficient documentation

## 2018-03-20 DIAGNOSIS — J189 Pneumonia, unspecified organism: Secondary | ICD-10-CM | POA: Insufficient documentation

## 2018-03-20 DIAGNOSIS — R0602 Shortness of breath: Secondary | ICD-10-CM

## 2018-03-20 DIAGNOSIS — Z794 Long term (current) use of insulin: Secondary | ICD-10-CM | POA: Diagnosis not present

## 2018-03-20 DIAGNOSIS — Z79899 Other long term (current) drug therapy: Secondary | ICD-10-CM | POA: Insufficient documentation

## 2018-03-20 DIAGNOSIS — Z85118 Personal history of other malignant neoplasm of bronchus and lung: Secondary | ICD-10-CM | POA: Insufficient documentation

## 2018-03-20 DIAGNOSIS — R079 Chest pain, unspecified: Secondary | ICD-10-CM | POA: Diagnosis present

## 2018-03-20 DIAGNOSIS — Z7982 Long term (current) use of aspirin: Secondary | ICD-10-CM | POA: Insufficient documentation

## 2018-03-20 DIAGNOSIS — E119 Type 2 diabetes mellitus without complications: Secondary | ICD-10-CM | POA: Insufficient documentation

## 2018-03-20 DIAGNOSIS — Z9889 Other specified postprocedural states: Secondary | ICD-10-CM | POA: Diagnosis not present

## 2018-03-20 LAB — CBC
HCT: 33.2 % — ABNORMAL LOW (ref 39.0–52.0)
HEMOGLOBIN: 11.1 g/dL — AB (ref 13.0–17.0)
MCH: 30.2 pg (ref 26.0–34.0)
MCHC: 33.4 g/dL (ref 30.0–36.0)
MCV: 90.5 fL (ref 78.0–100.0)
PLATELETS: 405 10*3/uL — AB (ref 150–400)
RBC: 3.67 MIL/uL — ABNORMAL LOW (ref 4.22–5.81)
RDW: 13.2 % (ref 11.5–15.5)
WBC: 12 10*3/uL — AB (ref 4.0–10.5)

## 2018-03-20 LAB — BASIC METABOLIC PANEL
ANION GAP: 8 (ref 5–15)
BUN: 20 mg/dL (ref 8–23)
CALCIUM: 9 mg/dL (ref 8.9–10.3)
CHLORIDE: 96 mmol/L — AB (ref 98–111)
CO2: 28 mmol/L (ref 22–32)
CREATININE: 0.87 mg/dL (ref 0.61–1.24)
GFR calc Af Amer: >60 mL/min (ref >60)
GFR calc non Af Amer: >60 mL/min (ref >60)
Glucose, Bld: 337 mg/dL — ABNORMAL HIGH (ref 70–99)
Potassium: 3 mmol/L — ABNORMAL LOW (ref 3.5–5.1)
SODIUM: 132 mmol/L — AB (ref 135–145)

## 2018-03-20 LAB — TROPONIN I

## 2018-03-20 MED ORDER — LEVOFLOXACIN IN D5W 500 MG/100ML IV SOLN
500.0000 mg | Freq: Once | INTRAVENOUS | Status: AC
  Administered 2018-03-21: 500 mg via INTRAVENOUS
  Filled 2018-03-20: qty 100

## 2018-03-20 MED ORDER — POTASSIUM CHLORIDE CRYS ER 20 MEQ PO TBCR
40.0000 meq | EXTENDED_RELEASE_TABLET | Freq: Once | ORAL | Status: AC
  Administered 2018-03-21: 40 meq via ORAL
  Filled 2018-03-20: qty 2

## 2018-03-20 MED ORDER — SODIUM CHLORIDE 0.9 % IV BOLUS
1000.0000 mL | Freq: Once | INTRAVENOUS | Status: AC
  Administered 2018-03-21: 1000 mL via INTRAVENOUS

## 2018-03-20 NOTE — ED Notes (Signed)
Pt denies CP, states it feels hard to take a deep breath

## 2018-03-20 NOTE — ED Triage Notes (Signed)
Pt c/o generalized chest pain and sob that started today.

## 2018-03-20 NOTE — ED Notes (Signed)
Pt. Ambulated with some difficulty due to having hip surgery. O2 Sat remained fairly constant between 95% and 91%.

## 2018-03-21 DIAGNOSIS — J189 Pneumonia, unspecified organism: Secondary | ICD-10-CM | POA: Diagnosis not present

## 2018-03-21 MED ORDER — LEVOFLOXACIN 500 MG PO TABS: 500.0000 mg | ORAL_TABLET | Freq: Every day | ORAL | 0 refills | Status: DC

## 2018-03-21 NOTE — Discharge Instructions (Signed)
Take the antibiotic until gone. Consider seeing your doctor about getting more tests done if you aren't feeling better. You did not want to get a CT scan tonight to check you for blood clots in your lungs. If you have blood clots in your lungs they can be fatal. You are at risk for them because of your recent surgery. Return to the ED if your breathing gets worse or you get chest pain. You can take mucinex DM OTC for cough.

## 2018-03-21 NOTE — ED Provider Notes (Addendum)
[facility] EMERGENCY DEPARTMENT Provider Note   CSN: [id number] Arrival date & time: 03/20/18  2202   Time seen 23:20 PM   History   Chief Complaint Chief Complaint  Patient presents with  . Chest Pain    HPI Julian Woods is a 72 y.o. male.  HPI patient states he felt okay this morning after his wife left for work he went out to go to breakfast and came home and he laid down and basically stayed in bed all day today.  His wife came home after work today about 6 PM.  He states he felt confused and dazed.  He states he "felt numb all over".  He cannot tell me what that means.  He denies that if being a feeling of having his leg go to sleep.  Wife reports he has increased cough that seems dry.  She denies fever.  She also states he seems more short of breath since he got up.  He denies wheezing.  He denies sore throat, rhinorrhea, nausea, vomiting, diarrhea, and he also denies chest pain to me.  He states he feels like he may be getting his chronic pancreatitis pain.  Patient had a anaphylactic reaction to albuterol nebulizer in the past.  Patient was diagnosed of lung cancer 2 years ago and had radiation therapy.  Patient still smokes.  Patient was admitted May 30 when he had diagnosis of pneumonia on the right and was treated with Levaquin.  He then was admitted on June 25 after a fall and having a right hip fracture that was repaired by Dr. Aline Brochure.  He left the hospital on June 28.  PCP [place]   Past Medical History:  Diagnosis Date  . Cataract   . Diabetes ([place])   . Loss of hearing   . Vision loss     Patient Active Problem List   Diagnosis Date Noted  . Chronic pain syndrome   . Closed subcapital fracture of right femur ([place])   . Tobacco abuse   . Closed right hip fracture ([place]) 03/07/2018  . Diabetes ([place]) 02/08/2018  . Encephalopathy acute 02/08/2018  . PNA (pneumonia) 02/06/2018  . Acute metabolic encephalopathy 64/40/3474  .  Hypokalemia 02/06/2018  . Dehydration 02/06/2018  . SOB (shortness of breath) 02/06/2018  . Lung cancer ([place]) 02/06/2018  . Acute respiratory failure with hypoxia ([place]) 02/06/2018  . Community acquired pneumonia of right lower lobe of lung [facility])     Past Surgical History:  Procedure Laterality Date  . APPENDECTOMY    . CATARACT EXTRACTION  2017  . EYE SURGERY    . HIP PINNING,CANNULATED Right 03/09/2018   Procedure: INTERNAL FIXATION RIGHT HIP;  Surgeon: Carole Civil, MD;  Location: [facility];  Service: Orthopedics;  Laterality: Right;  . LUNG BIOPSY    . WRIST FRACTURE SURGERY          Home Medications    Prior to Admission medications   Medication Sig Start Date End Date Taking? Authorizing Provider  acetaminophen (TYLENOL) 325 MG tablet Take 650 mg by mouth every 6 (six) hours as needed for mild pain or moderate pain.   Yes [provider]  ARTIFICIAL TEAR OP Apply 1 drop to eye daily.   Yes [provider]  aspirin EC 325 MG EC tablet Take 1 tablet (325 mg total) by mouth daily with breakfast. 03/11/18  Yes Barton Dubois, MD  aspirin-sod bicarb-citric acid (ALKA-SELTZER) 325 MG TBEF tablet Take 325 mg  by mouth every 6 (six) hours as needed.   Yes [provider]  bismuth subsalicylate (PEPTO-BISMOL) 262 MG/15ML suspension Take 30 mLs by mouth every 6 (six) hours as needed (stomach).   Yes [provider]  calcium carbonate (TUMS - DOSED IN MG ELEMENTAL CALCIUM) 500 MG chewable tablet Chew 1 tablet by mouth daily as needed for indigestion or heartburn.   Yes [provider]  co-enzyme Q-10 30 MG capsule Take 30 mg by mouth daily.   Yes [provider]  ibuprofen (ADVIL,MOTRIN) 200 MG tablet Take 200 mg by mouth every 6 (six) hours as needed for mild pain or moderate pain.   Yes [provider]  insulin aspart (NOVOLOG FLEXPEN) 100 UNIT/ML FlexPen Inject 5-20 Units into the skin 3 (three) times daily with meals. Per  sliding scale for high blood sugar levels after levels reach 250. To keep levels at 150 per patient   Yes [provider]  insulin aspart protamine- aspart (NOVOLOG MIX 70/30) (70-30) 100 UNIT/ML injection Inject 5-20 Units into the skin 3 (three) times daily as needed (for high blood sugar levels after levels reach 250).    Yes [provider]  ketorolac (ACULAR) 0.5 % ophthalmic solution Place 1 drop into the left eye every 4 (four) hours.   Yes [provider]  morphine (MSIR) 30 MG tablet Take 30-60 mg by mouth every 8 (eight) hours as needed for moderate pain or severe pain.   Yes [provider]  multivitamin-lutein (OCUVITE-LUTEIN) CAPS capsule Take 1 capsule by mouth daily.   Yes [provider]  Potassium 99 MG TABS Take 1 tablet by mouth daily.   Yes [provider]  traMADol (ULTRAM) 50 MG tablet Take 100 mg by mouth 3 (three) times daily as needed for moderate pain.    Yes [provider]  VITAMIN A PO Take 1 tablet by mouth daily.   Yes [provider]  vitamin C (ASCORBIC ACID) 500 MG tablet Take 500 mg by mouth daily.   Yes [provider]  VITAMIN E PO Take 1 tablet by mouth daily.   Yes [provider]  docusate sodium (COLACE) 100 MG capsule Take 1 capsule (100 mg total) by mouth 2 (two) times daily. Patient not taking: Reported on 03/20/2018 03/10/18   Barton Dubois, MD  levofloxacin (LEVAQUIN) 500 MG tablet Take 1 tablet (500 mg total) by mouth daily. 03/21/18   Rolland Porter, MD  nicotine (NICODERM CQ - DOSED IN MG/24 HOURS) 21 mg/24hr patch Place 1 patch (21 mg total) onto the skin daily. Patient not taking: Reported on 03/20/2018 03/10/18 03/10/19  Barton Dubois, MD  potassium chloride SA (K-DUR,KLOR-CON) 20 MEQ tablet Take 1 tablet (20 mEq total) by mouth daily. Patient not taking: Reported on 03/20/2018 02/07/18   Julianne Rice, MD    Family History No family history on file.  Social  History Social History   Tobacco Use  . Smoking status: Current Every Day Smoker    Packs/day: 1.00  . Smokeless tobacco: Never Used  Substance Use Topics  . Alcohol use: Never    Frequency: Never  . Drug use: Never  lives at home Lives with spouse When asked how much he smokes he states "how ever much I want"   Allergies   Ventolin [albuterol]   Review of Systems Review of Systems  All other systems reviewed and are negative.    Physical Exam Updated Vital Signs BP (!) 160/68   Pulse  84   Temp 98 F (36.7 C) (Oral)   Resp (!) 22   SpO2 95%   Vital signs normal except hypertension   Physical Exam  Constitutional: He is oriented to person, place, and time. He appears well-developed and well-nourished.  Non-toxic appearance. He does not appear ill. No distress.  HENT:  Head: Normocephalic and atraumatic.  Right Ear: External ear normal.  Left Ear: External ear normal.  Nose: Nose normal. No mucosal edema or rhinorrhea.  Mouth/Throat: Oropharynx is clear and moist and mucous membranes are normal. No dental abscesses or uvula swelling.  Eyes: Pupils are equal, round, and reactive to light. Conjunctivae and EOM are normal.  Neck: Normal range of motion and full passive range of motion without pain. Neck supple.  Cardiovascular: Normal rate, regular rhythm and normal heart sounds. Exam reveals no gallop and no friction rub.  No murmur heard. Pulmonary/Chest: Effort normal. No respiratory distress. He has no wheezes. He has rhonchi in the right middle field, the right lower field, the left middle field and the left lower field. He has no rales. He exhibits no tenderness and no crepitus.  Abdominal: Soft. Normal appearance and bowel sounds are normal. He exhibits no distension. There is no tenderness. There is no rebound and no guarding.  Musculoskeletal: Normal range of motion. He exhibits no edema or tenderness.  Moves all extremities well.   Neurological: He is alert  and oriented to person, place, and time. He has normal strength. No cranial nerve deficit.  Skin: Skin is warm, dry and intact. No rash noted. No erythema. No pallor.  Psychiatric: He has a normal mood and affect. His speech is normal and behavior is normal. His mood appears not anxious.  Nursing note and vitals reviewed.    ED Treatments / Results  Labs (all labs ordered are listed, but only abnormal results are displayed)  Results for orders placed or performed during the hospital encounter of 75/17/00  Basic metabolic panel  Result Value Ref Range   Sodium 132 (L) 135 - 145 mmol/L   Potassium 3.0 (L) 3.5 - 5.1 mmol/L   Chloride 96 (L) 98 - 111 mmol/L   CO2 28 22 - 32 mmol/L   Glucose, Bld 337 (H) 70 - 99 mg/dL   BUN 20 8 - 23 mg/dL   Creatinine, Ser 0.87 0.61 - 1.24 mg/dL   Calcium 9.0 8.9 - 10.3 mg/dL   GFR calc non Af Amer >60 >60 mL/min   GFR calc Af Amer >60 >60 mL/min   Anion gap 8 5 - 15  CBC  Result Value Ref Range   WBC 12.0 (H) 4.0 - 10.5 K/uL   RBC 3.67 (L) 4.22 - 5.81 MIL/uL   Hemoglobin 11.1 (L) 13.0 - 17.0 g/dL   HCT 33.2 (L) 39.0 - 52.0 %   MCV 90.5 78.0 - 100.0 fL   MCH 30.2 26.0 - 34.0 pg   MCHC 33.4 30.0 - 36.0 g/dL   RDW 13.2 11.5 - 15.5 %   Platelets 405 (H) 150 - 400 K/uL  Troponin I  Result Value Ref Range   Troponin I <0.03 <0.03 ng/mL     Laboratory interpretation all normal except new leukocytosis, mild anemia since his hip surgery, hypokalemia and hyperglycemia (wife states today he has been drinking regular drinks rather than diet".   EKG EKG Interpretation  Date/Time:  Monday March 20 2018 22:11:39 EDT Ventricular Rate:  91 PR Interval:    QRS Duration: 84 QT Interval:  355 QTC Calculation: 437 R Axis:   66 Text Interpretation:  Sinus rhythm Borderline Right axis deviation U waves present No significant change since last tracing 07 Mar 2018 Confirmed by Rolland Porter [phone]) on 03/20/2018 11:10:38 PM   Radiology Dg Chest 2  View  Result Date: 03/20/2018 CLINICAL DATA:  Generalized chest pain and shortness of breath today. EXAM: CHEST - 2 VIEW COMPARISON:  Chest radiograph March 07, 2018 and CT chest March 07, 2018 FINDINGS: Mild interstitial prominence, faint lower LEFT upper lobe airspace opacity. No pleural effusion. No pneumothorax. Cardiomediastinal silhouette is normal, calcified aortic arch. No pneumothorax. Soft tissue planes and included osseous structures are non suspicious. Surgical clips in the included right abdomen compatible with cholecystectomy. IMPRESSION: Faint LEFT upper lobe atelectasis versus pneumonia. Mild chronic interstitial changes/COPD. Electronically Signed   By: Elon Alas M.D.   On: 03/20/2018 22:33    Procedures Procedures (including critical care time)  Medications Ordered in ED Medications  sodium chloride 0.9 % bolus 1,000 mL (0 mLs Intravenous Stopped 03/21/18 0130)  levofloxacin (LEVAQUIN) IVPB 500 mg (0 mg Intravenous Stopped 03/21/18 0108)  potassium chloride SA (K-DUR,KLOR-CON) CR tablet 40 mEq (40 mEq Oral Given 03/21/18 0000)     Initial Impression / Assessment and Plan / ED Course  I have reviewed the triage vital signs and the nursing notes.  Pertinent labs & imaging results that were available during my care of the patient were reviewed by me and considered in my medical decision making (see chart for details).     I would normally have given the patient an albuterol nebulizer treatment however he has a severe reaction to it, his daughter states anaphylaxis.  Patient continues to smoke.  He keeps saying he wants something at home to may help him breathe.  Patient does not appear to be short of breath and is talking easily.  Patient's chest x-ray shows something new in the left upper lung and his pneumonia before was in the right lung.  He was started back on Levaquin.  Patient was ambulated by nursing staff and they report his pulse ox remained 91 to 94% on room air,  he had some difficulty walking due to his recent hip surgery.  01:35 AM Talked to patient about getting a CTA of his chest to look for PE. Pt wants to go home   Final Clinical Impressions(s) / ED Diagnoses   Final diagnoses:  HCAP (healthcare-associated pneumonia)  Shortness of breath    ED Discharge Orders        Ordered    levofloxacin (LEVAQUIN) 500 MG tablet  Daily     03/21/18 0159     Plan discharge  Rolland Porter, MD, Barbette Or, MD 03/21/18 9417    Rolland Porter, MD 03/21/18 [phone]

## 2018-03-21 NOTE — ED Notes (Signed)
ED Provider at bedside. 

## 2018-03-22 DIAGNOSIS — Z8781 Personal history of (healed) traumatic fracture: Principal | ICD-10-CM

## 2018-03-22 DIAGNOSIS — Z9889 Other specified postprocedural states: Secondary | ICD-10-CM | POA: Insufficient documentation

## 2018-03-24 ENCOUNTER — Ambulatory Visit (INDEPENDENT_AMBULATORY_CARE_PROVIDER_SITE_OTHER): Payer: Self-pay | Admitting: Orthopedic Surgery

## 2018-03-24 ENCOUNTER — Ambulatory Visit (INDEPENDENT_AMBULATORY_CARE_PROVIDER_SITE_OTHER): Payer: Medicare Other

## 2018-03-24 DIAGNOSIS — Z9889 Other specified postprocedural states: Secondary | ICD-10-CM

## 2018-03-24 DIAGNOSIS — Z967 Presence of other bone and tendon implants: Secondary | ICD-10-CM

## 2018-03-24 DIAGNOSIS — Z8781 Personal history of (healed) traumatic fracture: Secondary | ICD-10-CM

## 2018-03-24 NOTE — Progress Notes (Signed)
POSTOP VISIT  POD #15  Chief Complaint  Patient presents with  . Follow-up    Hospital follow up on right hip, DOS 03-09-18.    72 year old male status post internal fixation right hip with cannulated screws for right femoral neck fracture on June 27     Encounter Diagnosis  Name Primary?  . S/P ORIF (open reduction internal fixation) fracture 03/09/18 Right hip cannulated screw placement     X-rays today show maintenance of fracture reduction impaction of the fracture as expected   Postoperative plan (Work, WB, No orders of the defined types were placed in this encounter. ,FU)  Weightbearing as tolerated  Follow-up in 6 weeks for x-ray

## 2018-04-07 ENCOUNTER — Emergency Department (HOSPITAL_COMMUNITY): Payer: Medicare Other

## 2018-04-07 ENCOUNTER — Emergency Department (HOSPITAL_COMMUNITY)
Admission: EM | Admit: 2018-04-07 | Discharge: 2018-04-07 | Disposition: A | Discharge status: Home / Self Care | Payer: Medicare Other | Attending: Emergency Medicine | Admitting: Emergency Medicine

## 2018-04-07 ENCOUNTER — Telehealth: Payer: Self-pay | Admitting: Orthopedic Surgery

## 2018-04-07 ENCOUNTER — Encounter (HOSPITAL_COMMUNITY): Payer: Self-pay | Admitting: Emergency Medicine

## 2018-04-07 ENCOUNTER — Other Ambulatory Visit: Payer: Self-pay

## 2018-04-07 DIAGNOSIS — Z7982 Long term (current) use of aspirin: Secondary | ICD-10-CM | POA: Insufficient documentation

## 2018-04-07 DIAGNOSIS — E119 Type 2 diabetes mellitus without complications: Secondary | ICD-10-CM | POA: Diagnosis not present

## 2018-04-07 DIAGNOSIS — Z96641 Presence of right artificial hip joint: Secondary | ICD-10-CM | POA: Insufficient documentation

## 2018-04-07 DIAGNOSIS — F17200 Nicotine dependence, unspecified, uncomplicated: Secondary | ICD-10-CM | POA: Diagnosis not present

## 2018-04-07 DIAGNOSIS — M79604 Pain in right leg: Secondary | ICD-10-CM | POA: Diagnosis not present

## 2018-04-07 DIAGNOSIS — E876 Hypokalemia: Secondary | ICD-10-CM

## 2018-04-07 DIAGNOSIS — Z794 Long term (current) use of insulin: Secondary | ICD-10-CM | POA: Diagnosis not present

## 2018-04-07 DIAGNOSIS — Z79899 Other long term (current) drug therapy: Secondary | ICD-10-CM | POA: Insufficient documentation

## 2018-04-07 HISTORY — DX: Malignant (primary) neoplasm, unspecified: C80.1

## 2018-04-07 HISTORY — DX: Other chronic pancreatitis: K86.1

## 2018-04-07 LAB — CBC WITH DIFFERENTIAL/PLATELET
BASOS PCT: 0 %
Basophils Absolute: 0 10*3/uL (ref 0.0–0.1)
Eosinophils Absolute: 0.1 10*3/uL (ref 0.0–0.7)
Eosinophils Relative: 2 %
HEMATOCRIT: 34.7 % — AB (ref 39.0–52.0)
Hemoglobin: 11.6 g/dL — ABNORMAL LOW (ref 13.0–17.0)
LYMPHS PCT: 22 %
Lymphs Abs: 2 10*3/uL (ref 0.7–4.0)
MCH: 30.1 pg (ref 26.0–34.0)
MCHC: 33.4 g/dL (ref 30.0–36.0)
MCV: 90.1 fL (ref 78.0–100.0)
MONO ABS: 0.6 10*3/uL (ref 0.1–1.0)
Monocytes Relative: 7 %
NEUTROS ABS: 6.2 10*3/uL (ref 1.7–7.7)
Neutrophils Relative %: 69 %
Platelets: 360 10*3/uL (ref 150–400)
RBC: 3.85 MIL/uL — ABNORMAL LOW (ref 4.22–5.81)
RDW: 14 % (ref 11.5–15.5)
WBC: 8.9 10*3/uL (ref 4.0–10.5)

## 2018-04-07 LAB — BASIC METABOLIC PANEL
Anion gap: 8 (ref 5–15)
BUN: 11 mg/dL (ref 8–23)
CO2: 32 mmol/L (ref 22–32)
CREATININE: 0.66 mg/dL (ref 0.61–1.24)
Calcium: 9.6 mg/dL (ref 8.9–10.3)
Chloride: 96 mmol/L — ABNORMAL LOW (ref 98–111)
GFR calc non Af Amer: >60 mL/min (ref >60)
GLUCOSE: 248 mg/dL — AB (ref 70–99)
Potassium: 2.8 mmol/L — ABNORMAL LOW (ref 3.5–5.1)
Sodium: 136 mmol/L (ref 135–145)

## 2018-04-07 MED ORDER — POTASSIUM CHLORIDE CRYS ER 20 MEQ PO TBCR
40.0000 meq | EXTENDED_RELEASE_TABLET | Freq: Once | ORAL | Status: AC
  Administered 2018-04-07: 40 meq via ORAL
  Filled 2018-04-07: qty 2

## 2018-04-07 MED ORDER — MORPHINE SULFATE 30 MG PO TABS: 30.0000 mg | ORAL_TABLET | Freq: Three times a day (TID) | ORAL | 0 refills | Status: AC

## 2018-04-07 MED ORDER — PROMETHAZINE HCL 12.5 MG PO TABS
12.5000 mg | ORAL_TABLET | Freq: Once | ORAL | Status: AC
  Administered 2018-04-07: 12.5 mg via ORAL
  Filled 2018-04-07: qty 1

## 2018-04-07 MED ORDER — HYDROMORPHONE HCL 1 MG/ML IJ SOLN
0.5000 mg | Freq: Once | INTRAMUSCULAR | Status: AC
  Administered 2018-04-07: 0.5 mg via INTRAVENOUS
  Filled 2018-04-07: qty 1

## 2018-04-07 MED ORDER — ONDANSETRON HCL 4 MG/2ML IJ SOLN
4.0000 mg | Freq: Once | INTRAMUSCULAR | Status: AC
  Administered 2018-04-07: 4 mg via INTRAVENOUS
  Filled 2018-04-07: qty 2

## 2018-04-07 MED ORDER — MORPHINE SULFATE (PF) 10 MG/ML IV SOLN
10.0000 mg | Freq: Once | INTRAVENOUS | Status: AC
  Administered 2018-04-07: 10 mg via INTRAVENOUS
  Filled 2018-04-07: qty 1

## 2018-04-07 NOTE — ED Provider Notes (Signed)
[facility] EMERGENCY DEPARTMENT Provider Note   CSN: [id number] Arrival date & time: 04/07/18  1750     History   Chief Complaint Chief Complaint  Patient presents with  . Leg Pain    HPI Julian Woods is a 72 y.o. male.  Patient is a 72 year old male who has a history of recent right hip replacement, lung cancer, and chronic pain syndrome.  The patient's pain medications are managed by the [facility].  The patient usually takes 30 to 60 mg of morphine 3 or 4 times daily for his pain.  The patient is currently out of his medication.  He was told that the medications had been mailed, but he has not received them yet.  Patient is now having severe pain in the right hip, thigh, knee, and extending down to the lower leg.  He experiences a cramp in his foot and ankle area from time to time.  He presents now for evaluation of this issue.  There is been no recent fall or trauma to the surgical area or to the right lower extremity.  The history is provided by the patient.    Past Medical History:  Diagnosis Date  . Cancer ([place])    lung cancer  . Cataract   . Chronic pancreatitis ([place])   . Diabetes ([place])   . Loss of hearing   . Vision loss     Patient Active Problem List   Diagnosis Date Noted  . S/P ORIF (open reduction internal fixation) fracture 03/09/18 Right hip cannulated screw placement 03/22/2018  . Chronic pain syndrome   . Closed subcapital fracture of right femur ([place])   . Tobacco abuse   . Closed right hip fracture ([place]) 03/07/2018  . Diabetes ([place]) 02/08/2018  . Encephalopathy acute 02/08/2018  . PNA (pneumonia) 02/06/2018  . Acute metabolic encephalopathy 24/05/7352  . Hypokalemia 02/06/2018  . Dehydration 02/06/2018  . SOB (shortness of breath) 02/06/2018  . Lung cancer ([place]) 02/06/2018  . Acute respiratory failure with hypoxia ([place]) 02/06/2018  . Community acquired pneumonia of right lower lobe of lung [facility])     Past Surgical  History:  Procedure Laterality Date  . APPENDECTOMY    . CATARACT EXTRACTION  2017  . EYE SURGERY    . HIP PINNING,CANNULATED Right 03/09/2018   Procedure: INTERNAL FIXATION RIGHT HIP;  Surgeon: Carole Civil, MD;  Location: [facility];  Service: Orthopedics;  Laterality: Right;  . LUNG BIOPSY    . WRIST FRACTURE SURGERY          Home Medications    Prior to Admission medications   Medication Sig Start Date End Date Taking? Authorizing Provider  acetaminophen (TYLENOL) 325 MG tablet Take 650 mg by mouth every 6 (six) hours as needed for mild pain or moderate pain.    [provider]  ARTIFICIAL TEAR OP Apply 1 drop to eye daily.    [provider]  aspirin EC 325 MG EC tablet Take 1 tablet (325 mg total) by mouth daily with breakfast. 03/11/18   Barton Dubois, MD  aspirin-sod bicarb-citric acid (ALKA-SELTZER) 325 MG TBEF tablet Take 325 mg by mouth every 6 (six) hours as needed.    [provider]  bismuth subsalicylate (PEPTO-BISMOL) 262 MG/15ML suspension Take 30 mLs by mouth every 6 (six) hours as needed (stomach).    [provider]  calcium carbonate (TUMS - DOSED IN MG ELEMENTAL CALCIUM) 500 MG chewable tablet Chew 1 tablet by mouth daily as  needed for indigestion or heartburn.    [provider]  co-enzyme Q-10 30 MG capsule Take 30 mg by mouth daily.    [provider]  docusate sodium (COLACE) 100 MG capsule Take 1 capsule (100 mg total) by mouth 2 (two) times daily. Patient not taking: Reported on 03/20/2018 03/10/18   Barton Dubois, MD  ibuprofen (ADVIL,MOTRIN) 200 MG tablet Take 200 mg by mouth every 6 (six) hours as needed for mild pain or moderate pain.    [provider]  insulin aspart (NOVOLOG FLEXPEN) 100 UNIT/ML FlexPen Inject 5-20 Units into the skin 3 (three) times daily with meals. Per sliding scale for high blood sugar levels after levels reach 250. To keep levels at 150 per patient    [provider]  insulin aspart protamine- aspart (NOVOLOG MIX 70/30) (70-30) 100 UNIT/ML injection Inject 5-20 Units into the skin 3 (three) times daily as needed (for high blood sugar levels after levels reach 250).     [provider]  ketorolac (ACULAR) 0.5 % ophthalmic solution Place 1 drop into the left eye every 4 (four) hours.    [provider]  levofloxacin (LEVAQUIN) 500 MG tablet Take 1 tablet (500 mg total) by mouth daily. 03/21/18   Rolland Porter, MD  morphine (MSIR) 30 MG tablet Take 30-60 mg by mouth every 8 (eight) hours as needed for moderate pain or severe pain.    [provider]  multivitamin-lutein (OCUVITE-LUTEIN) CAPS capsule Take 1 capsule by mouth daily.    [provider]  nicotine (NICODERM CQ - DOSED IN MG/24 HOURS) 21 mg/24hr patch Place 1 patch (21 mg total) onto the skin daily. Patient not taking: Reported on 03/20/2018 03/10/18 03/10/19  Barton Dubois, MD  Potassium 99 MG TABS Take 1 tablet by mouth daily.    [provider]  potassium chloride SA (K-DUR,KLOR-CON) 20 MEQ tablet Take 1 tablet (20 mEq total) by mouth daily. Patient not taking: Reported on 03/20/2018 02/07/18   Julianne Rice, MD  traMADol (ULTRAM) 50 MG tablet Take 100 mg by mouth 3 (three) times daily as needed for moderate pain.     [provider]  VITAMIN A PO Take 1 tablet by mouth daily.    [provider]  vitamin C (ASCORBIC ACID) 500 MG tablet Take 500 mg by mouth daily.    [provider]  VITAMIN E PO Take 1 tablet by mouth daily.    [provider]    Family History History reviewed. No pertinent family history.  Social History Social History   Tobacco Use  . Smoking status: Current Every Day Smoker    Packs/day: 1.00  . Smokeless tobacco: Never Used  Substance Use Topics  . Alcohol use: Never    Frequency: Never  . Drug use: Never     Allergies   Ventolin [albuterol]   Review of Systems Review of  Systems  Constitutional: Negative for activity change.       All ROS Neg except as noted in HPI  HENT: Negative for nosebleeds.   Eyes: Negative for photophobia and discharge.  Respiratory: Positive for shortness of breath. Negative for cough and wheezing.   Cardiovascular: Negative for chest pain and palpitations.  Gastrointestinal: Negative for abdominal pain and blood in stool.  Genitourinary: Negative for dysuria, frequency and hematuria.  Musculoskeletal: Positive for arthralgias. Negative for back pain and neck pain.  Skin: Negative.   Neurological: Negative for dizziness, seizures and speech difficulty.  Psychiatric/Behavioral:  Negative for confusion and hallucinations.     Physical Exam Updated Vital Signs BP (!) 146/70 (BP Location: Right Arm)   Pulse 79   Temp 97.6 F (36.4 C) (Oral)   Resp 20   Ht 5\' 7"  (1.702 m)   Wt 54.4 kg (120 lb)   SpO2 96%   BMI 18.79 kg/m   Physical Exam  Constitutional: He is oriented to person, place, and time. He appears well-developed and well-nourished.  Non-toxic appearance.  HENT:  Head: Normocephalic.  Right Ear: Tympanic membrane and external ear normal.  Left Ear: Tympanic membrane and external ear normal.  Eyes: Pupils are equal, round, and reactive to light. EOM and lids are normal.  Neck: Normal range of motion. Neck supple. Carotid bruit is not present.  Cardiovascular: Normal rate, regular rhythm, normal heart sounds, intact distal pulses and normal pulses.  Pulmonary/Chest: Breath sounds normal. No respiratory distress.  Coarse breath sounds with some laboring of respiration.  Abdominal: Soft. Bowel sounds are normal. There is no tenderness. There is no guarding.  Musculoskeletal: Normal range of motion.  Patient has a well-healed surgical scar at the right hip area.  There is no red streaking.  There is no drainage from the area.  The wound appears to be completely healed.  The surgical site is not hot to touch.  There is  pain to palpation of the right knee.  There is pain with flexion and extension of the knee.  There is no effusion noted.  There is no posterior mass appreciated.  There is no swelling of the right calf, no redness of the right lower extremity.  There is full range of motion of the toes.  The dorsalis pedis pulses 2+.  Lymphadenopathy:       Head (right side): No submandibular adenopathy present.       Head (left side): No submandibular adenopathy present.    He has no cervical adenopathy.  Neurological: He is alert and oriented to person, place, and time. He has normal strength. No cranial nerve deficit or sensory deficit.  Skin: Skin is warm and dry.  Psychiatric: He has a normal mood and affect. His speech is normal.  Nursing note and vitals reviewed.    ED Treatments / Results  Labs (all labs ordered are listed, but only abnormal results are displayed) Labs Reviewed  CBC WITH DIFFERENTIAL/PLATELET - Abnormal; Notable for the following components:      Result Value   RBC 3.85 (*)    Hemoglobin 11.6 (*)    HCT 34.7 (*)    All other components within normal limits  BASIC METABOLIC PANEL    EKG None  Radiology Dg Knee Complete 4 Views Right  Result Date: 04/07/2018 CLINICAL DATA:  Surgery 3-4 weeks ago. EXAM: RIGHT KNEE - COMPLETE 4+ VIEW COMPARISON:  March 07, 2018 FINDINGS: A radiopaque foreign bodies identified just into the skin, superior to the patella, unchanged. Vascular calcifications are noted. No other acute abnormalities. IMPRESSION: Radiopaque foreign body just beneath the skin superior to the patella, unchanged since March 07, 2018. Recommend clinical correlation. No other abnormality. Electronically Signed   By: Dorise Bullion III M.D   On: 04/07/2018 19:00   Dg Hip Unilat W Or Wo Pelvis 2-3 Views Right  Result Date: 04/07/2018 CLINICAL DATA:  Right hip and knee pain. EXAM: DG HIP (WITH OR WITHOUT PELVIS) 2-3V RIGHT COMPARISON:  03/13/2018 FINDINGS: Recent right femoral  neck fracture and pinning. Screw alignment is similar to prior. Degree  of impaction is increased from fluoroscopy 03/09/2018 but stable from 03/24/2018 radiograph. Chronic deformity of the right anterior superior iliac spine. No evidence of acute pelvic ring fracture. Negative visualized left hip. IMPRESSION: Recent right femoral neck fracture and repair. Impaction is increased from fluoroscopy 03/09/2018 but stable from 03/24/2018 outpatient radiograph. No new abnormality. Electronically Signed   By: Monte Fantasia M.D.   On: 04/07/2018 19:08    Procedures Procedures (including critical care time)  Medications Ordered in ED Medications  HYDROmorphone (DILAUDID) injection 0.5 mg (0.5 mg Intravenous Given 04/07/18 2044)  ondansetron (ZOFRAN) injection 4 mg (4 mg Intravenous Given 04/07/18 2044)     Initial Impression / Assessment and Plan / ED Course  I have reviewed the triage vital signs and the nursing notes.  Pertinent labs & imaging results that were available during my care of the patient were reviewed by me and considered in my medical decision making (see chart for details).       Final Clinical Impressions(s) / ED Diagnoses MDm  The patient states the pain in his right lower extremity is getting worse and worse.  His wife who is a Marine scientist says that she is not seeing him in this much pain in a very long time.  There is been no temperature elevation reported.  An x-ray of the right knee and the right hip are negative for acute fractures or dislocations.  The hardware appears to be intact at the hip replacement site.  Will obtain blood work to evaluate for possible impending infection.  We will also check electrolytes.  The complete blood count shows the white blood cells to be well within normal limits.  There is mild decrease in the hemoglobin and hematocrit, but not acute.  The potassium is low at 2.8.  The glucose is slightly elevated at 248.  Patient treated in the emergency  department with oral potassium 40 mEq.  Patient given IV Dilaudid.  Patient states that this did not help.  Patient given IV morphine with some improvement of the patient's pain.  Patient seen with me by Dr. Dayna Barker.  Patient will be given a prescription for 5 days of oral morphine in the event there is a delay in the patient getting his medication from the [facility].  I have also asked the patient to increase foods high in potassium.  The patient states that he has some oral potassium that should be on the way.  The patient will follow up with his orthopedic specialist as well as his physicians at the [facility].  He will return to the emergency department if any changes in condition, problems, or concerns.   Final diagnoses:  Right leg pain  Hypokalemia  History of right hip replacement    ED Discharge Orders    None       Lily Kocher, Hershal Coria 04/07/18 2243    Mesner, Corene Cornea, MD 04/07/18 6301    Merrily Pew, MD 04/07/18 [phone]

## 2018-04-07 NOTE — ED Notes (Signed)
Pt is a pt of the [facility] in [place] He reports that he has called regarding his medicine They are mailing it next week He also reports a 25-30 pound weight loss in the last month  Awaiting reeval and DC

## 2018-04-07 NOTE — ED Provider Notes (Signed)
Medical screening examination/treatment/procedure(s) were conducted as a shared visit with non-physician practitioner(s) and myself.  I personally evaluated the patient during the encounter.  Patient with leg pain likely secondary to inadequate pain control secondary to being out of his prescription. Wound appears clean/dry/intact.  Pain is improved with the medications given here.  Plan for prescription for same.  [facility]  None     Maigan Bittinger, Corene Cornea, MD 04/07/18 [phone]

## 2018-04-07 NOTE — ED Notes (Signed)
Recently broke his hip with plate and screw repair by Dr Raynelle Dick out and now with pain to incision site as well as to knee area

## 2018-04-07 NOTE — ED Triage Notes (Addendum)
Patient states he had surgery to his right leg approximately 3-4 weeks ago and had the staples removed 10 days ago. Complaining of pain to right hip radiating into right knee since staples were removed. Spouse states patient takes morphine but has been out of meds x 4 days.

## 2018-04-07 NOTE — Telephone Encounter (Signed)
I spoke to wife and Dr Aline Brochure he is asking for Morphine 30 mg IR he has a Rx from the [facility] from 2 days ago he has not received yet. I have explained he can not do this,Dr Aline Brochure has had me tell them to use the Tramadol until he gets the Morphine.

## 2018-04-07 NOTE — Telephone Encounter (Signed)
Patient's wife called about pain medication.   PATIENT USES [facility] IN [facility]  Please call and advise [phone] Pamala Hurry)

## 2018-04-07 NOTE — Discharge Instructions (Addendum)
Your x-ray is negative for acute fracture.  Your hardware appears to be in place.  Your complete blood count is negative for any signs of advancing infection.  Your electrolytes however show that your potassium was low.  Please increase foods high in potassium, and please have your primary physician recheck your potassium sometime next week.  You will receive a prescription for a few days of your pain medication in the event there is a delay in the [facility] getting your medication to you.

## 2018-04-13 ENCOUNTER — Telehealth: Payer: Self-pay | Admitting: Orthopedic Surgery

## 2018-04-13 NOTE — Telephone Encounter (Signed)
Patient's wife Pamala Hurry) called wanting Dr. Aline Brochure to review patient's x-rays from his ER visit on 04/07/18. She stated he had fell and had to go the ER. I told her that Dr. Aline Brochure was out of the office for a couple of weeks, but that Dr. Luna Glasgow was looking after his patients until he returns. I told her that Dr. Luna Glasgow would be back in the office on Tuesday. She said, ok thank you and hung up.

## 2018-04-14 NOTE — Telephone Encounter (Signed)
A lady called and stated she is the patient's daughter, Larene [place] ?).  She said she needed to speak to someone regarding her father.  She asked for Dr. Aline Brochure and I told her that he is out of the office.  She then asked for our xray tech, Renee.  I told her that she was not in the office today.  She asked if we had a clinical person here and I told her we did and that her name was Leeann Must, LPN. I then asked if she would like to speak to [facility] and the lady hung up on me.

## 2018-05-05 ENCOUNTER — Ambulatory Visit (INDEPENDENT_AMBULATORY_CARE_PROVIDER_SITE_OTHER): Payer: Medicare Other

## 2018-05-05 ENCOUNTER — Encounter: Payer: Self-pay | Admitting: Orthopedic Surgery

## 2018-05-05 ENCOUNTER — Ambulatory Visit (INDEPENDENT_AMBULATORY_CARE_PROVIDER_SITE_OTHER): Payer: Medicare Other | Admitting: Orthopedic Surgery

## 2018-05-05 VITALS — BP 156/84 | HR 89 | Ht 67.0 in

## 2018-05-05 DIAGNOSIS — Z967 Presence of other bone and tendon implants: Secondary | ICD-10-CM | POA: Diagnosis not present

## 2018-05-05 DIAGNOSIS — Z8781 Personal history of (healed) traumatic fracture: Secondary | ICD-10-CM | POA: Diagnosis not present

## 2018-05-05 DIAGNOSIS — Z9889 Other specified postprocedural states: Secondary | ICD-10-CM

## 2018-05-05 NOTE — Progress Notes (Signed)
Postop fracture care Chief Complaint  Patient presents with  . Routine Post Op    hip surgery / cannulated screws 03/11/18  . Leg Pain    has right buttock pain down right leg    Patient has had several falls.  He complains of pain over his hardware  His x-ray shows a fracture is not healed yet has not displaced possibility of nonunion is possible versus delayed union  Had a CT scan which showed no evidence of healing and no displacement  CT scan was done in [place]  X-ray in 6 weeks walker for 6 weeks

## 2018-06-14 ENCOUNTER — Ambulatory Visit: Payer: Self-pay | Admitting: Orthopedic Surgery

## 2018-07-10 IMAGING — CT CT HEAD W/O CM
3 series · 16 of 47 positions shown, 19 images · non-contrast
Comparison: None.

CLINICAL DATA: Shortness of breath, chest pain, confusion

EXAM:
CT HEAD WITHOUT CONTRAST
TECHNIQUE: Contiguous axial images were obtained from the base of the skull
through the vertex without intravenous contrast.

[Series 2: head wo · axial · 0.40mm/px · z∈[+132,+267]mm · 10 of 33 slices shown, 13 images]
[im 3/33  brain]
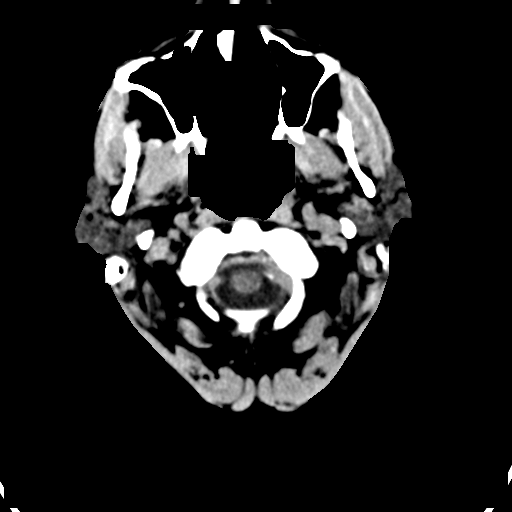
[im 3/33  bone]
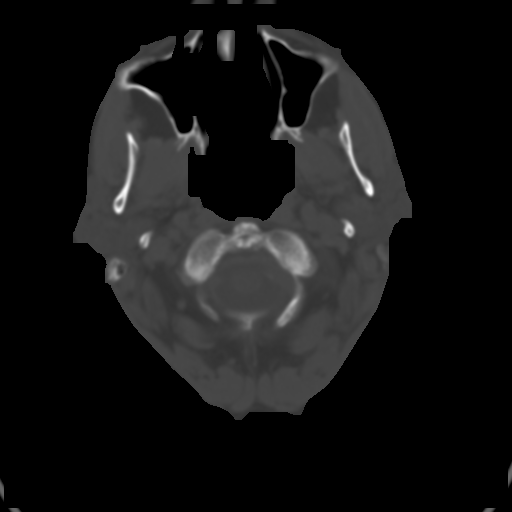
[im 6/33  brain]
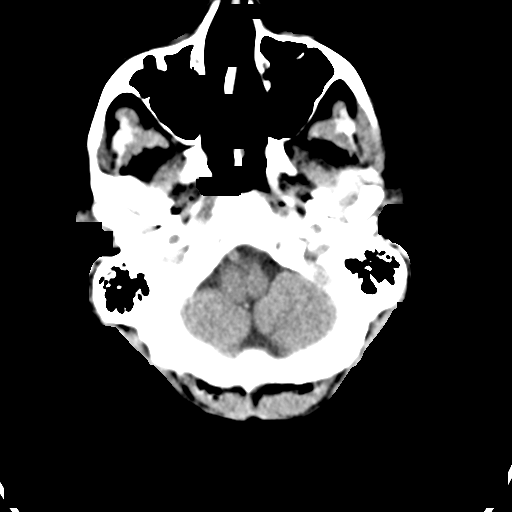
[im 9/33  brain]
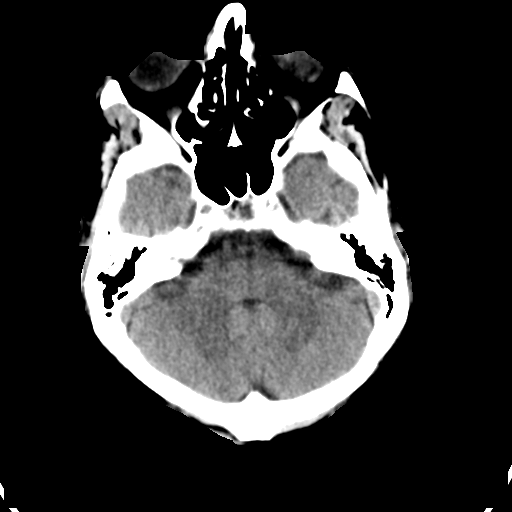
[im 12/33  brain]
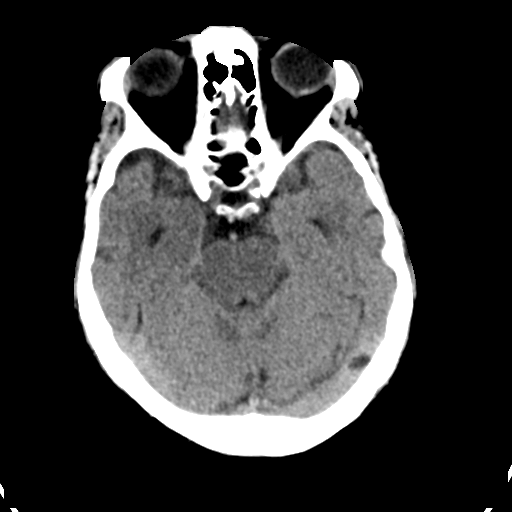
[im 15/33  brain]
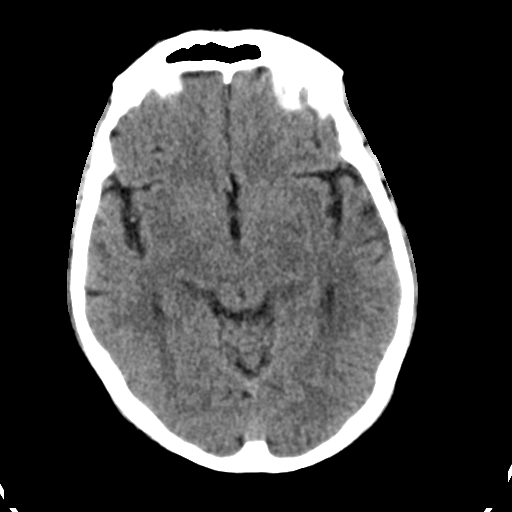
[im 15/33  bone]
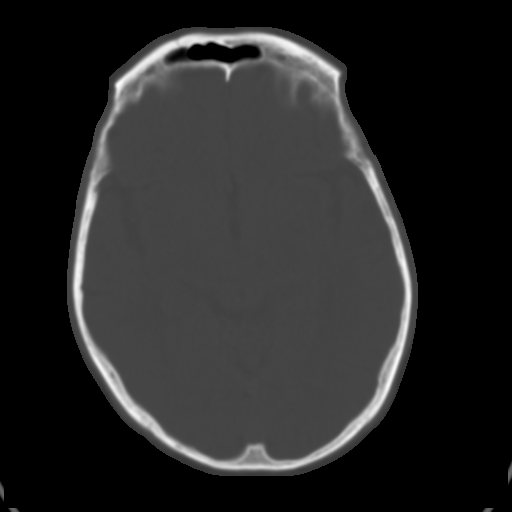
[im 18/33  brain]
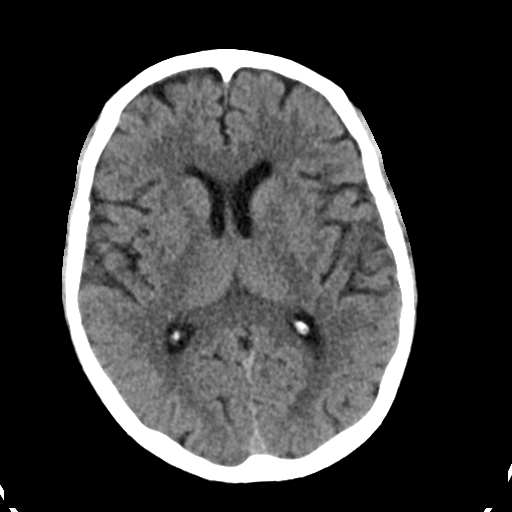
[im 21/33  brain]
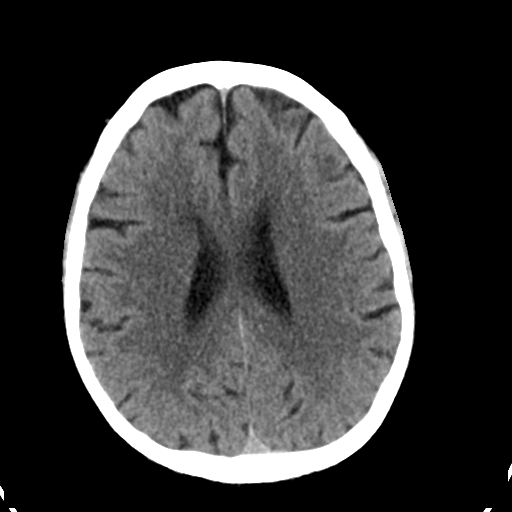
[im 25/33  brain]
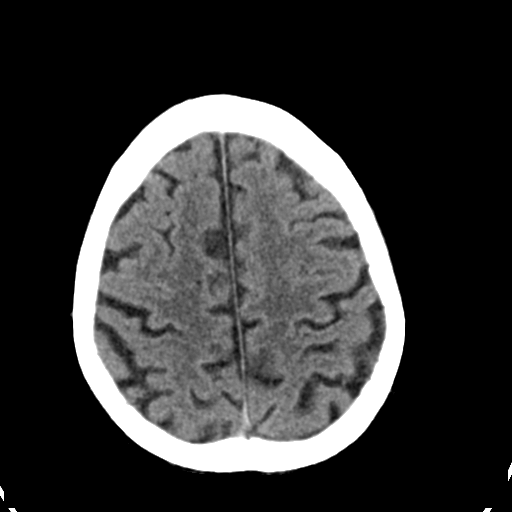
[im 27/33  brain]
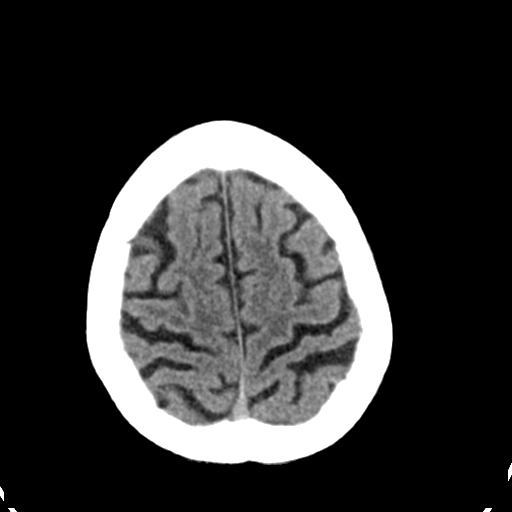
[im 27/33  bone]
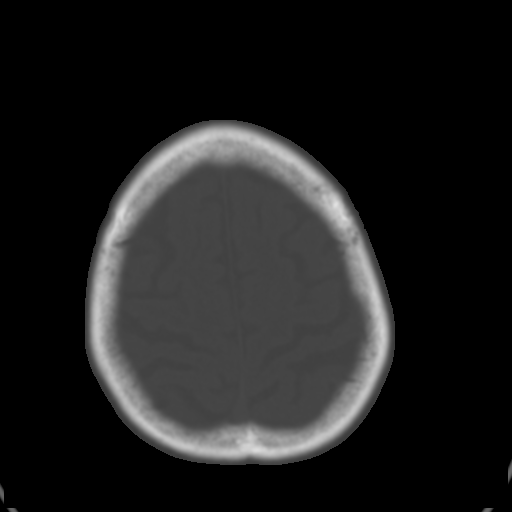
[im 30/33  brain]
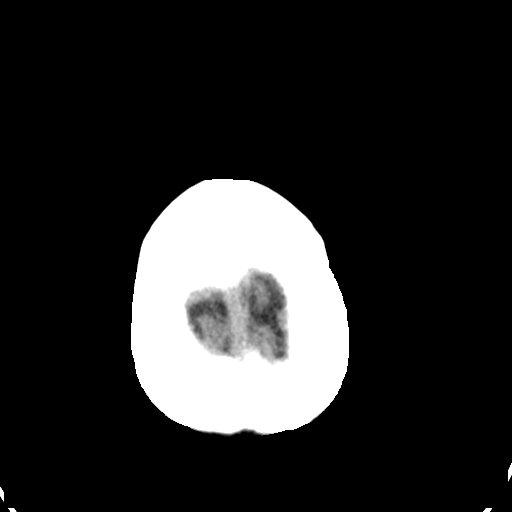

[Series 4: coronal soft tissue · coronal · 0.30mm/px · 3 of 66 slices shown]
[im 22/66  brain]
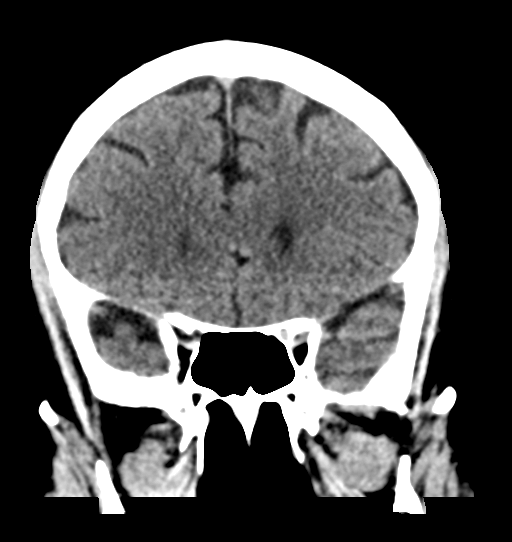
[im 29/66  brain]
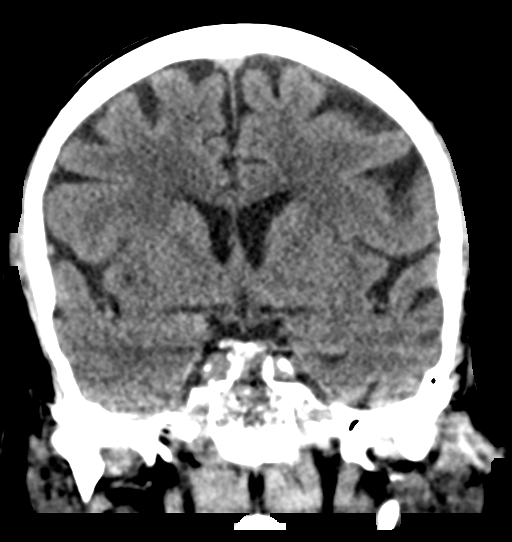
[im 37/66  brain]
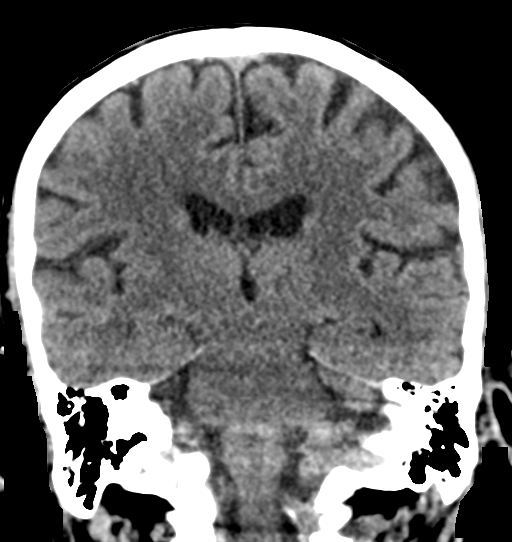

[Series 5: sagittal soft tissue · sagittal · 0.33mm/px · 3 of 51 slices shown]
[im 17/51  brain]
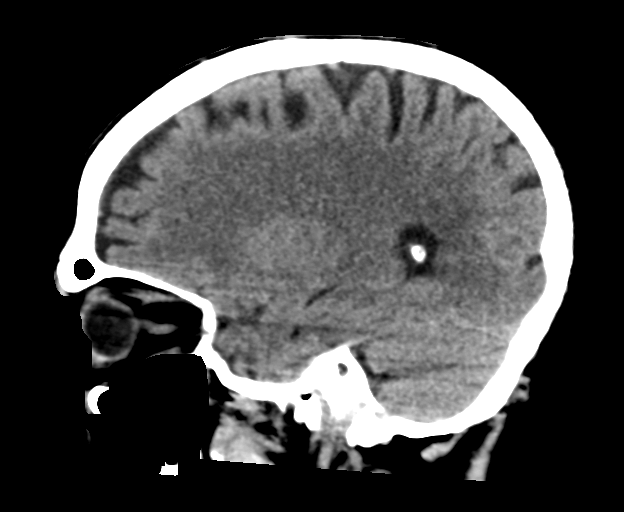
[im 26/51  brain]
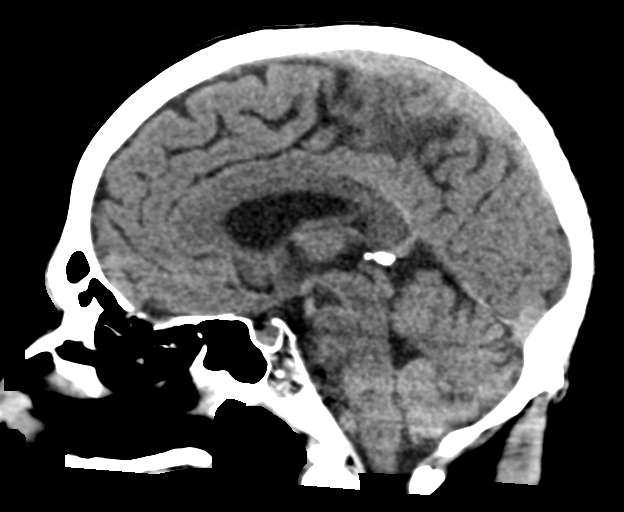
[im 34/51  brain]
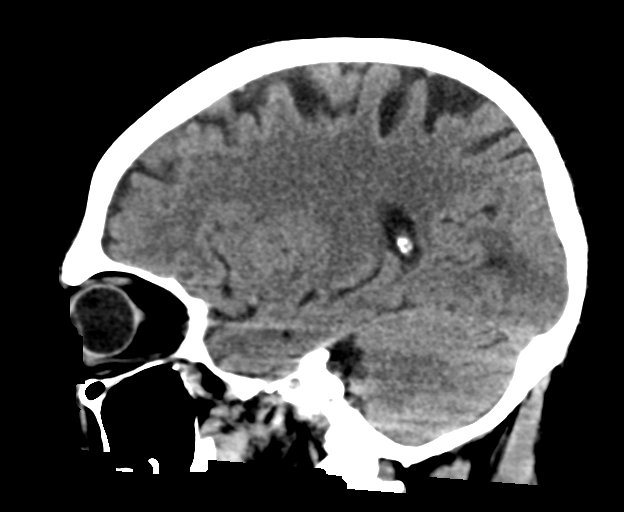

[16 of 47 positions shown; findings below may reference images not displayed]

FINDINGS: Brain: No evidence of acute infarction, hemorrhage, extra-axial
collection, ventriculomegaly, or mass effect. Mild cerebral atrophy.

Vascular: Cerebrovascular atherosclerotic calcifications are noted.

Skull: Negative for fracture or focal lesion.

Sinuses/Orbits: Visualized portions of the orbits are unremarkable.
Visualized portions of the paranasal sinuses and mastoid air cells
are unremarkable.

Other: None.
IMPRESSION: No acute intracranial pathology.

## 2018-07-10 IMAGING — CT CT ANGIO CHEST
2 of 6 series · 18 of 46 positions shown · IV contrast (Isovue)
Comparison: Radiographs of same day.  CT scan April 13, 2015.

CLINICAL DATA: Shortness of breath, chest pain. History of lung
cancer.

EXAM:
CT ANGIOGRAPHY CHEST WITH CONTRAST
TECHNIQUE: Multidetector CT imaging of the chest was performed using the
standard protocol during bolus administration of intravenous
contrast. Multiplanar CT image reconstructions and MIPs were
obtained to evaluate the vascular anatomy.
CONTRAST:  100mL 4BA1N3-KBR IOPAMIDOL (4BA1N3-KBR) INJECTION 76%

[Series 5: thins · axial · 0.71mm/px · z∈[-267,-2]mm · 15 of 291 slices shown]
[im 13/291  lung]
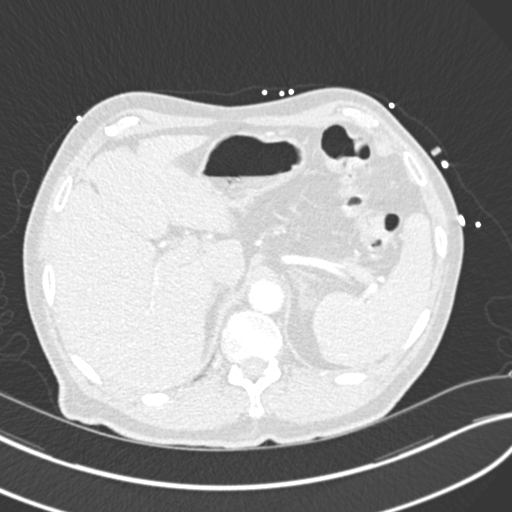
[im 38/291  soft-tissue]
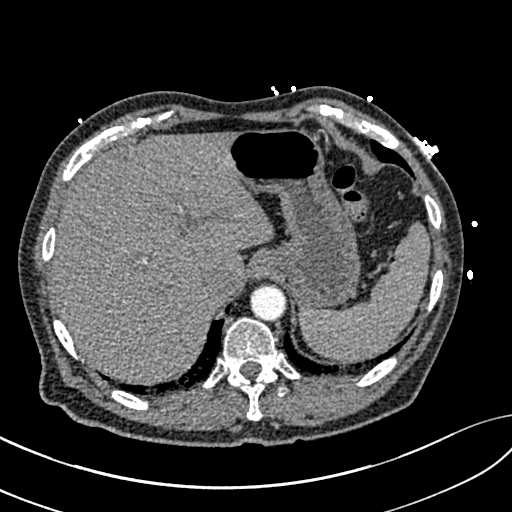
[im 51/291  lung]
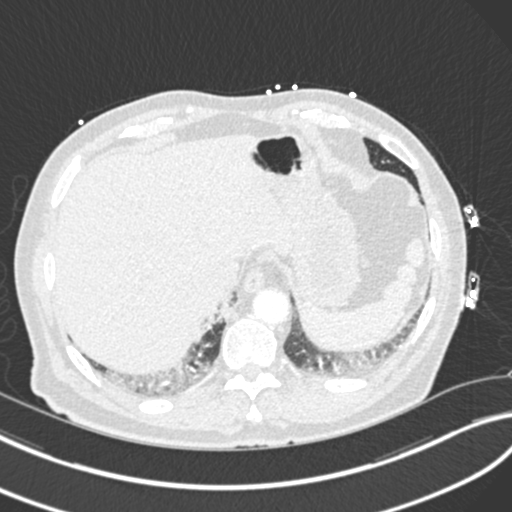
[im 76/291  soft-tissue]
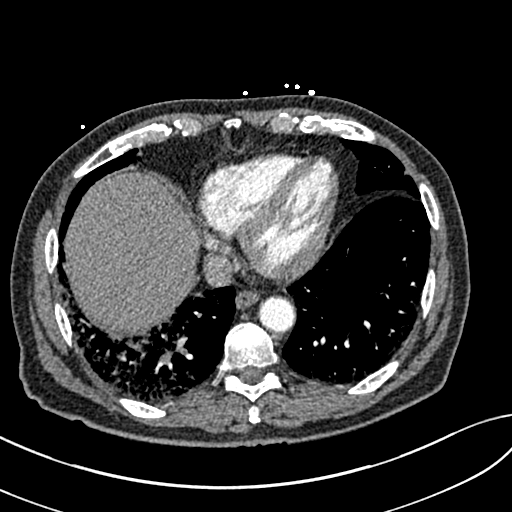
[im 89/291  lung]
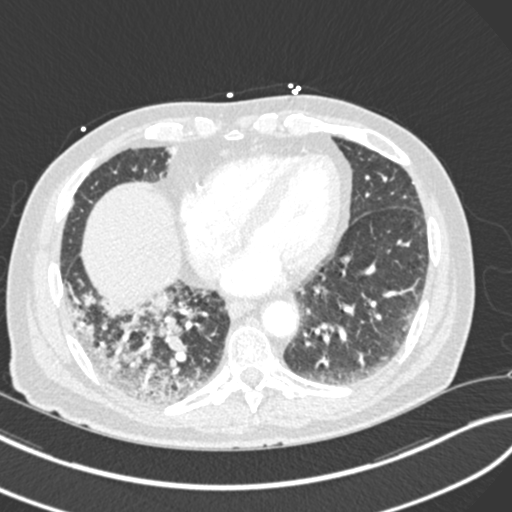
[im 114/291  soft-tissue]
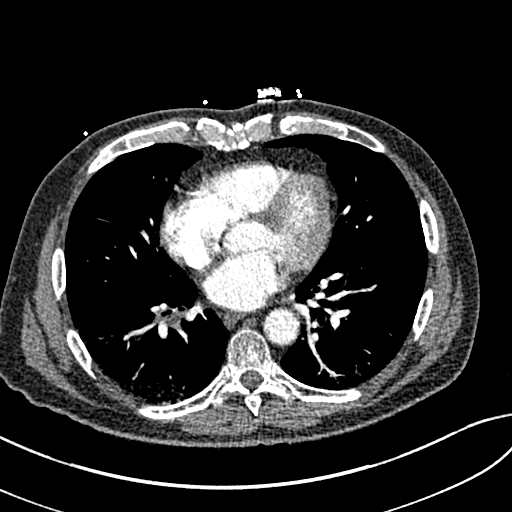
[im 127/291  lung]
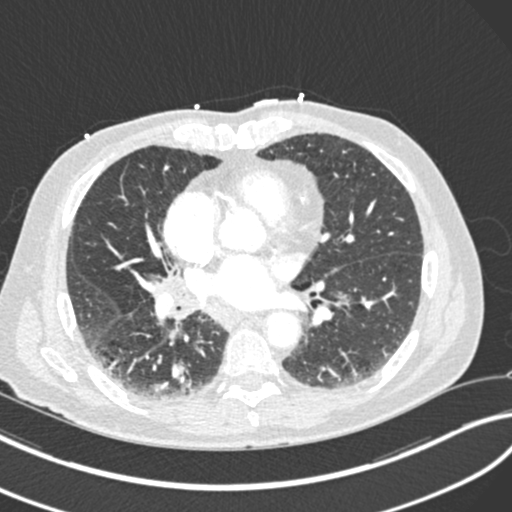
[im 152/291  soft-tissue]
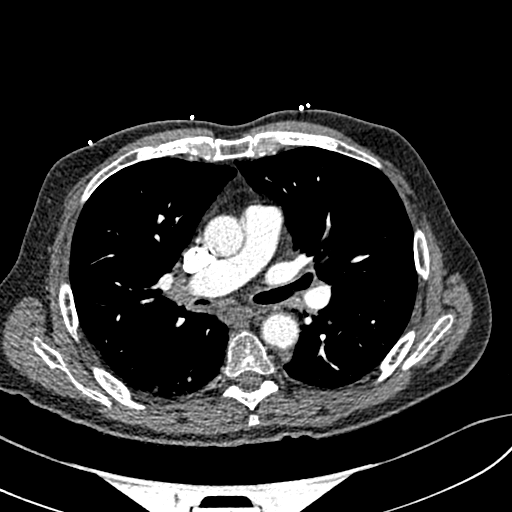
[im 164/291  lung]
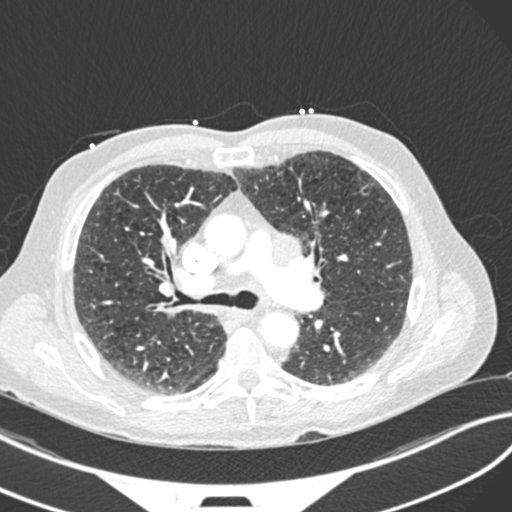
[im 177/291  soft-tissue]
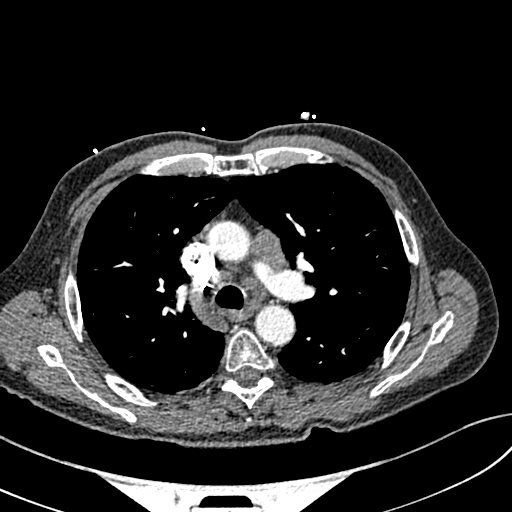
[im 202/291  lung]
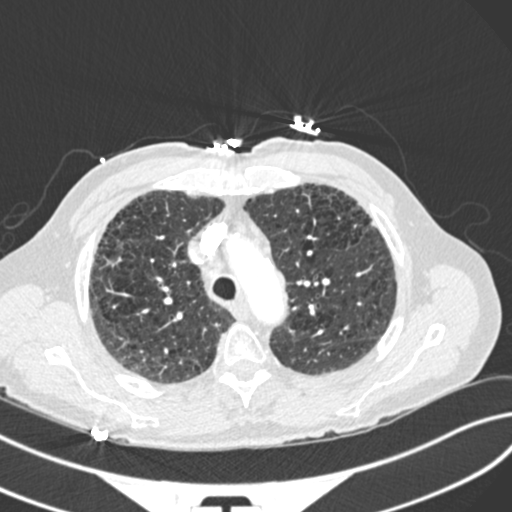
[im 215/291  soft-tissue]
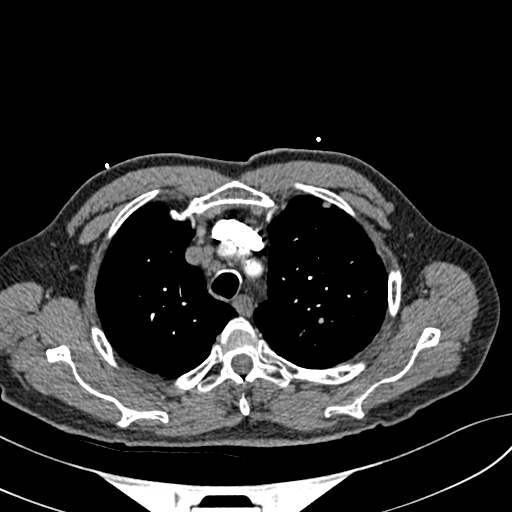
[im 240/291  lung]
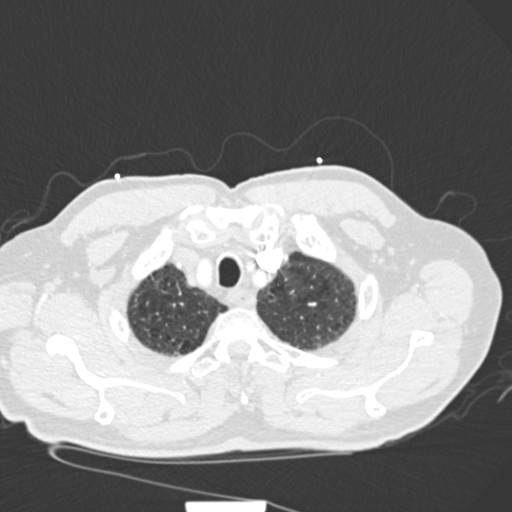
[im 253/291  soft-tissue]
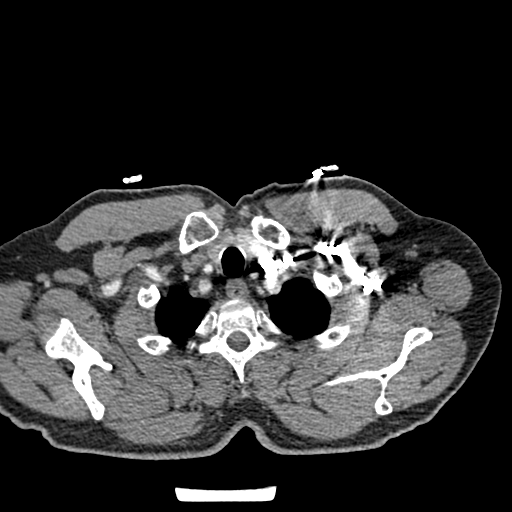
[im 278/291  lung]
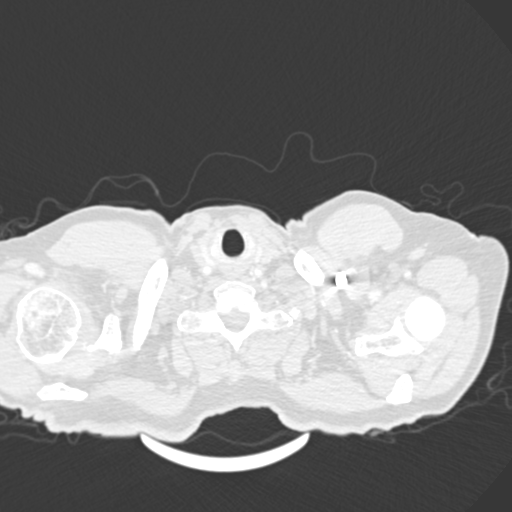

[Series 7: coronal mpr · coronal · 0.59mm/px · 3 of 120 slices shown]
[im 30/120  soft-tissue]
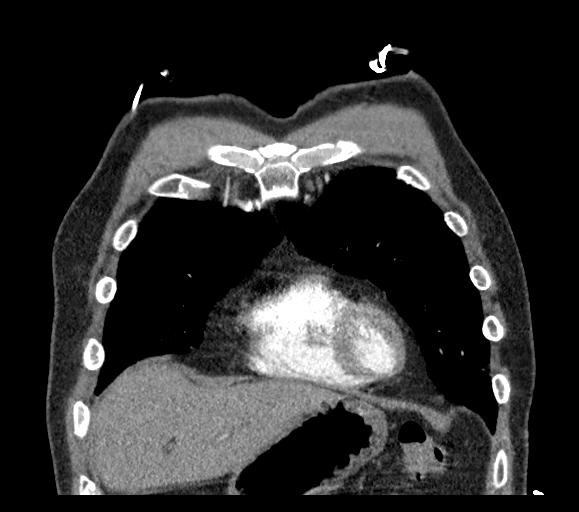
[im 60/120  soft-tissue]
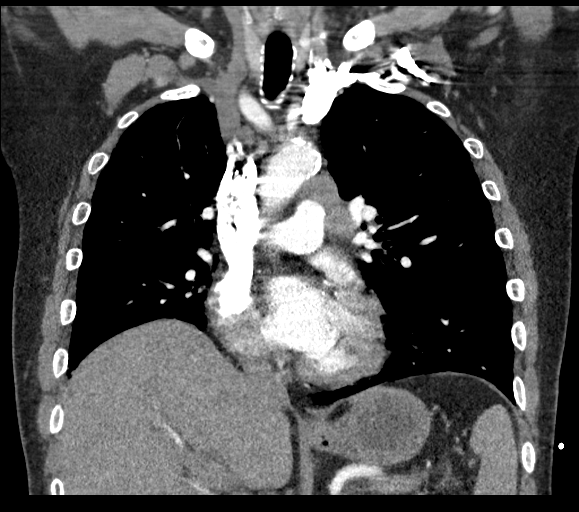
[im 90/120  soft-tissue]
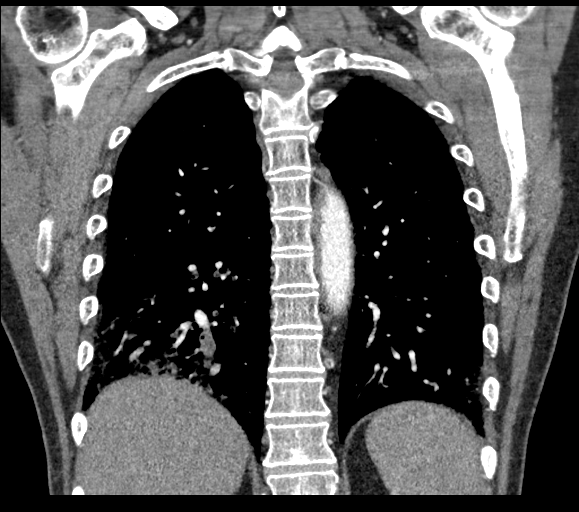

[18 of 46 positions shown; findings below may reference images not displayed]

FINDINGS: Cardiovascular: Satisfactory opacification of the pulmonary arteries
to the segmental level. No evidence of pulmonary embolism. Normal
heart size. No pericardial effusion. Mild coronary artery
calcifications are noted.

Mediastinum/Nodes: Esophagus and thyroid gland are unremarkable.
x 2.1 cm lymph node is noted in aortopulmonary window. 17 mm right
hilar lymph node is noted. 19 mm subcarinal adenopathy is noted. 11
mm right paratracheal lymph node is noted.

Lungs/Pleura: No pneumothorax or pleural effusion is noted.
Emphysematous disease is noted in both upper lobes. Right lower lobe
opacity is noted concerning for pneumonia. Soft tissue density is
noted in the bronchi of the right lower lobe concerning for
aspirated material or mucous plugging. Irregular density is seen in
area of mass noted on prior CT scan, with 11 x 10 mm mass now noted.

Upper Abdomen: No acute abnormality.

Musculoskeletal: No chest wall abnormality. No acute or significant
osseous findings.

Review of the MIP images confirms the above findings.
IMPRESSION: No definite evidence of pulmonary embolus.

Coronary artery calcifications are noted suggesting coronary artery
disease.

Significantly enlarged mediastinal adenopathy is noted concerning
for metastatic disease or malignancy.

Soft tissue material is noted in the bronchi of the right lower lobe
concerning for aspirated material or mucous plugging. Right lower
lobe basilar opacity is noted concerning for pneumonia or possibly
atelectasis.

Left upper lobe nodule or mass seen on prior exam is not well
visualized currently. There is now seen irregular densities in this
area which may represent radiation fibrosis with small residual mass
measuring 11 x 10 mm.

Aortic Atherosclerosis (737CP-V8S.S) and Emphysema (737CP-MQ5.P).

## 2018-07-14 DEATH — deceased

## 2018-08-21 IMAGING — DX DG CHEST 2V
2 series · 2 of 2 positions shown · non-contrast
Comparison: Chest radiograph March 07, 2018 and CT chest March 07, 2018

CLINICAL DATA: Generalized chest pain and shortness of breath
today.

EXAM:
CHEST - 2 VIEW

[chest lat]
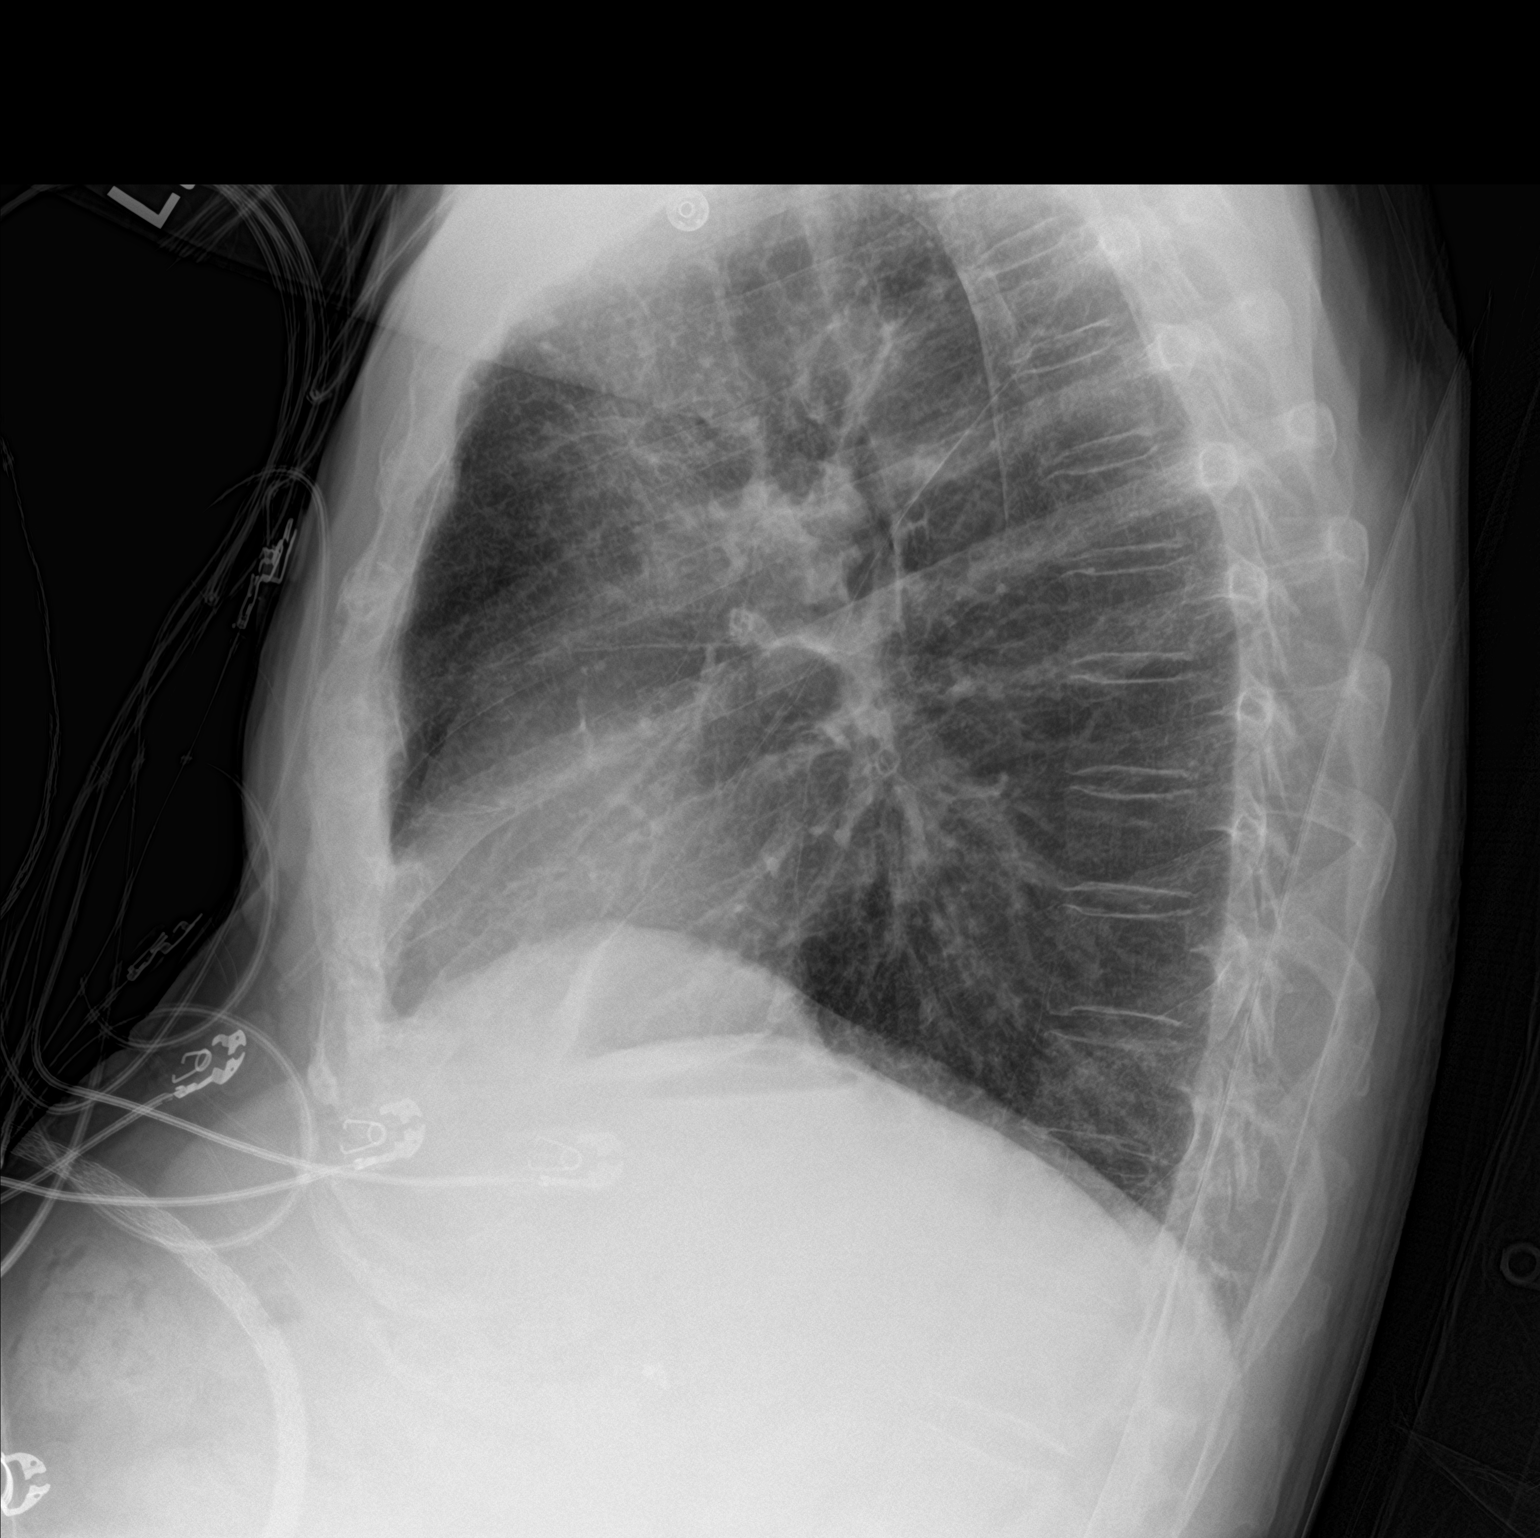

[chest ap]
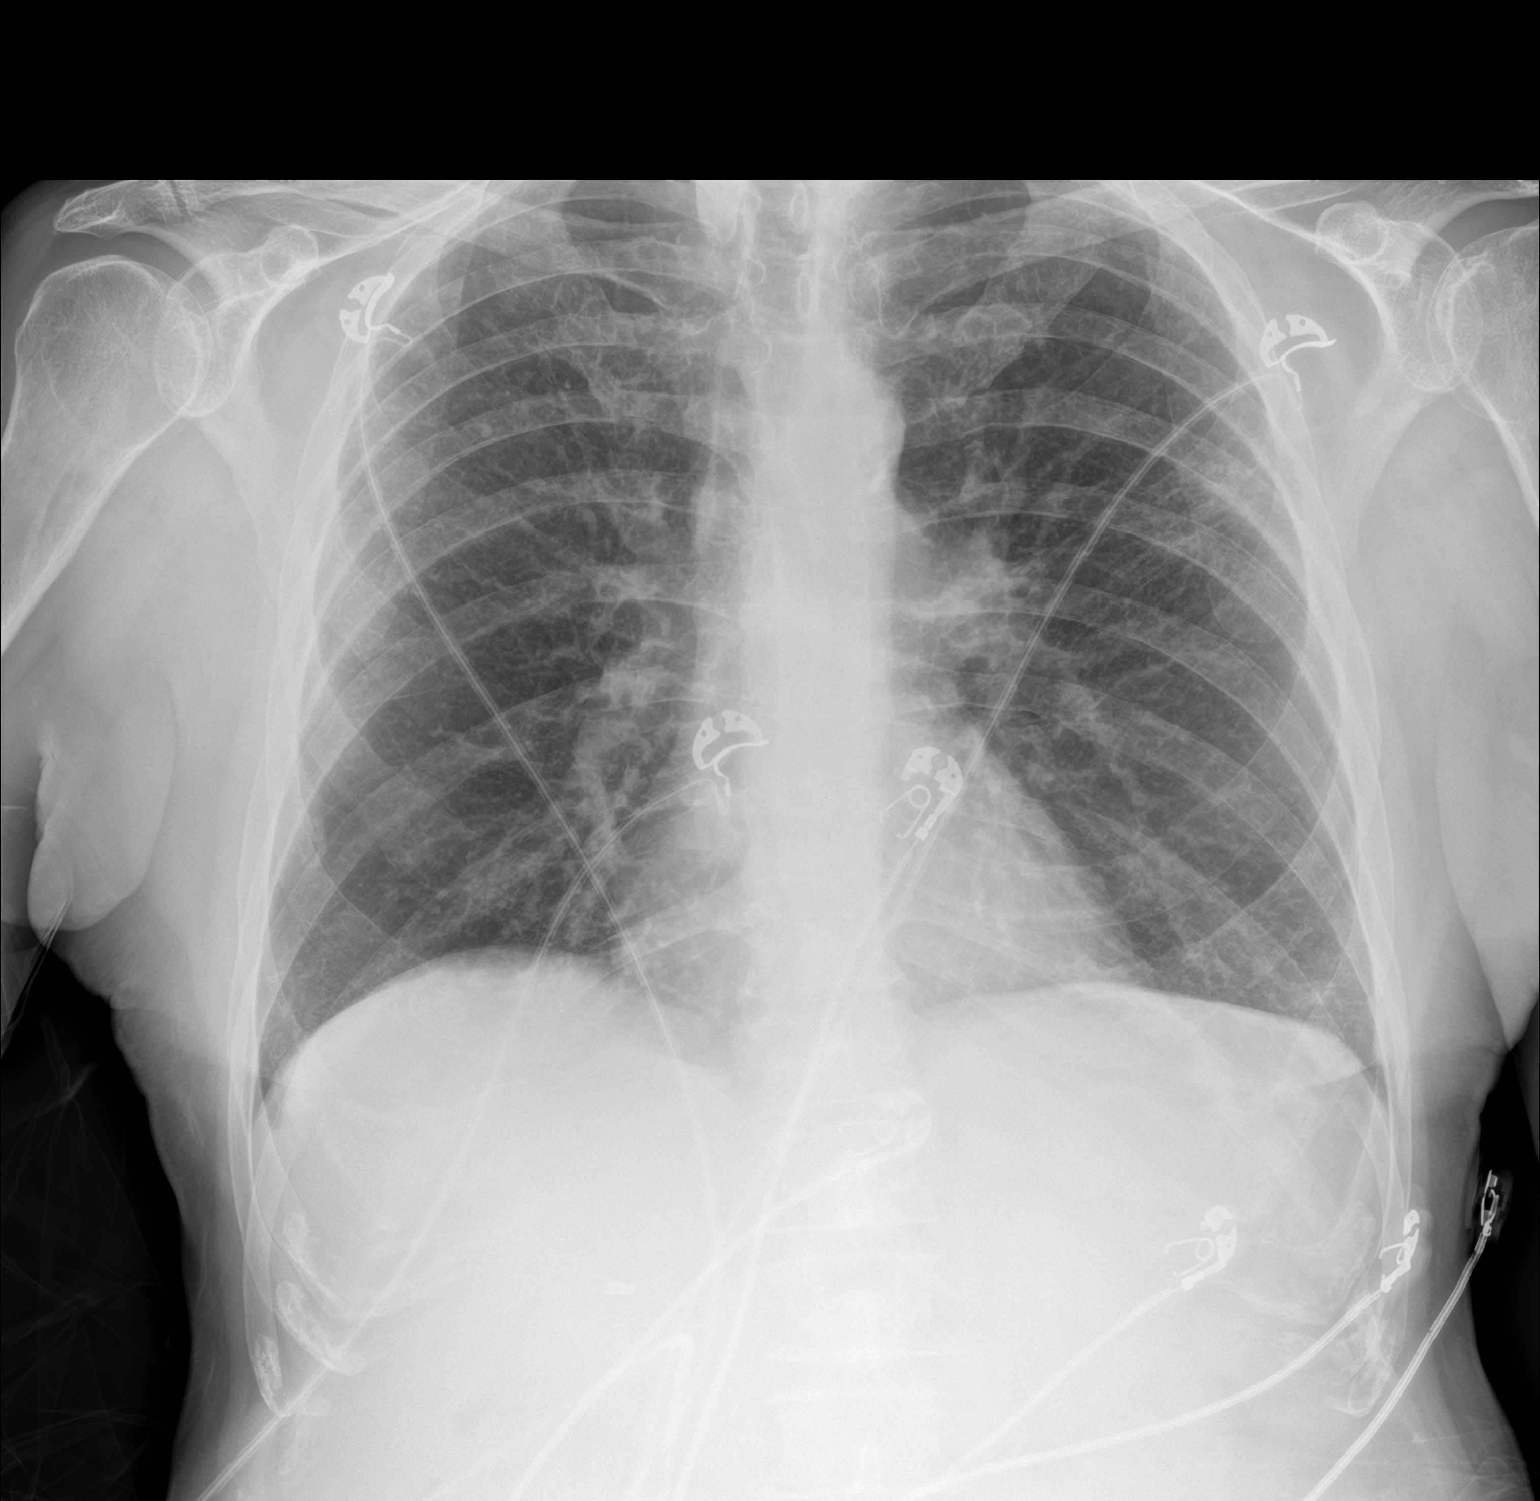

[2 of 2 positions shown; findings below may reference images not displayed]

FINDINGS: Mild interstitial prominence, faint lower LEFT upper lobe airspace
opacity. No pleural effusion. No pneumothorax. Cardiomediastinal
silhouette is normal, calcified aortic arch. No pneumothorax. Soft
tissue planes and included osseous structures are non suspicious.
Surgical clips in the included right abdomen compatible with
cholecystectomy.
IMPRESSION: Faint LEFT upper lobe atelectasis versus pneumonia.

Mild chronic interstitial changes/COPD.
